# Patient Record
Sex: Female | Born: 1945 | Race: White | Hispanic: No | Smoking: Never smoker
Health system: Southern US, Community
[De-identification: ages and names within clinical notes are randomized; demographics above are authoritative.]

## PROBLEM LIST (undated history)

## (undated) DIAGNOSIS — C801 Malignant (primary) neoplasm, unspecified: Secondary | ICD-10-CM

## (undated) DIAGNOSIS — I700 Atherosclerosis of aorta: Secondary | ICD-10-CM

## (undated) DIAGNOSIS — T8859XA Other complications of anesthesia, initial encounter: Secondary | ICD-10-CM

## (undated) DIAGNOSIS — K5790 Diverticulosis of intestine, part unspecified, without perforation or abscess without bleeding: Secondary | ICD-10-CM

## (undated) DIAGNOSIS — F32A Depression, unspecified: Secondary | ICD-10-CM

## (undated) DIAGNOSIS — K8020 Calculus of gallbladder without cholecystitis without obstruction: Secondary | ICD-10-CM

## (undated) DIAGNOSIS — T4145XA Adverse effect of unspecified anesthetic, initial encounter: Secondary | ICD-10-CM

## (undated) DIAGNOSIS — I341 Nonrheumatic mitral (valve) prolapse: Secondary | ICD-10-CM

## (undated) DIAGNOSIS — M810 Age-related osteoporosis without current pathological fracture: Secondary | ICD-10-CM

## (undated) DIAGNOSIS — Z8679 Personal history of other diseases of the circulatory system: Secondary | ICD-10-CM

## (undated) DIAGNOSIS — E872 Acidosis, unspecified: Secondary | ICD-10-CM

## (undated) DIAGNOSIS — U071 COVID-19: Secondary | ICD-10-CM

## (undated) DIAGNOSIS — E7800 Pure hypercholesterolemia, unspecified: Secondary | ICD-10-CM

## (undated) DIAGNOSIS — K851 Biliary acute pancreatitis without necrosis or infection: Secondary | ICD-10-CM

## (undated) DIAGNOSIS — F329 Major depressive disorder, single episode, unspecified: Secondary | ICD-10-CM

## (undated) DIAGNOSIS — K219 Gastro-esophageal reflux disease without esophagitis: Secondary | ICD-10-CM

## (undated) DIAGNOSIS — E042 Nontoxic multinodular goiter: Secondary | ICD-10-CM

## (undated) DIAGNOSIS — K319 Disease of stomach and duodenum, unspecified: Secondary | ICD-10-CM

## (undated) HISTORY — PX: TONSILLECTOMY: SUR1361

## (undated) HISTORY — PX: ABDOMINAL HYSTERECTOMY: SHX81

## (undated) HISTORY — PX: BACK SURGERY: SHX140

## (undated) HISTORY — PX: TUBAL LIGATION: SHX77

## (undated) HISTORY — PX: OTHER SURGICAL HISTORY: SHX169

## (undated) HISTORY — PX: FOOT SURGERY: SHX648

## (undated) HISTORY — PX: COLONOSCOPY: SHX174

---

## 2004-11-21 ENCOUNTER — Ambulatory Visit: Payer: Self-pay | Admitting: Internal Medicine

## 2004-11-28 ENCOUNTER — Ambulatory Visit: Payer: Self-pay | Admitting: Internal Medicine

## 2005-05-30 ENCOUNTER — Ambulatory Visit: Payer: Self-pay | Admitting: Internal Medicine

## 2005-06-29 ENCOUNTER — Emergency Department: Payer: Self-pay | Admitting: Emergency Medicine

## 2005-09-07 ENCOUNTER — Ambulatory Visit: Payer: Self-pay | Admitting: Unknown Physician Specialty

## 2005-12-03 ENCOUNTER — Ambulatory Visit: Payer: Self-pay | Admitting: Internal Medicine

## 2007-03-19 ENCOUNTER — Ambulatory Visit: Payer: Self-pay | Admitting: Internal Medicine

## 2007-04-08 ENCOUNTER — Ambulatory Visit: Payer: Self-pay | Admitting: Gastroenterology

## 2008-04-06 ENCOUNTER — Ambulatory Visit: Payer: Self-pay | Admitting: Internal Medicine

## 2008-04-13 ENCOUNTER — Ambulatory Visit: Payer: Self-pay | Admitting: Internal Medicine

## 2009-04-07 ENCOUNTER — Ambulatory Visit: Payer: Self-pay | Admitting: Internal Medicine

## 2010-08-23 ENCOUNTER — Ambulatory Visit: Payer: Self-pay | Admitting: General Practice

## 2011-02-12 ENCOUNTER — Ambulatory Visit: Payer: Self-pay | Admitting: Internal Medicine

## 2012-02-26 ENCOUNTER — Ambulatory Visit: Payer: Self-pay

## 2012-03-12 ENCOUNTER — Ambulatory Visit: Payer: Self-pay | Admitting: Gastroenterology

## 2012-03-14 LAB — PATHOLOGY REPORT

## 2013-02-26 ENCOUNTER — Ambulatory Visit: Payer: Self-pay

## 2013-03-06 ENCOUNTER — Ambulatory Visit: Payer: Self-pay

## 2014-03-16 ENCOUNTER — Ambulatory Visit: Payer: Self-pay

## 2014-07-30 ENCOUNTER — Emergency Department
Admission: EM | Admit: 2014-07-30 | Discharge: 2014-07-30 | Disposition: A | Discharge status: Home / Self Care | Payer: PPO | Attending: Emergency Medicine | Admitting: Emergency Medicine

## 2014-07-30 ENCOUNTER — Emergency Department: Payer: PPO

## 2014-07-30 ENCOUNTER — Encounter: Payer: Self-pay | Admitting: Emergency Medicine

## 2014-07-30 DIAGNOSIS — Y9389 Activity, other specified: Secondary | ICD-10-CM | POA: Insufficient documentation

## 2014-07-30 DIAGNOSIS — S40811A Abrasion of right upper arm, initial encounter: Secondary | ICD-10-CM

## 2014-07-30 DIAGNOSIS — S8992XA Unspecified injury of left lower leg, initial encounter: Secondary | ICD-10-CM | POA: Insufficient documentation

## 2014-07-30 DIAGNOSIS — Z79899 Other long term (current) drug therapy: Secondary | ICD-10-CM | POA: Insufficient documentation

## 2014-07-30 DIAGNOSIS — W010XXA Fall on same level from slipping, tripping and stumbling without subsequent striking against object, initial encounter: Secondary | ICD-10-CM | POA: Insufficient documentation

## 2014-07-30 DIAGNOSIS — Y9289 Other specified places as the place of occurrence of the external cause: Secondary | ICD-10-CM | POA: Insufficient documentation

## 2014-07-30 DIAGNOSIS — Y998 Other external cause status: Secondary | ICD-10-CM | POA: Insufficient documentation

## 2014-07-30 DIAGNOSIS — S40021A Contusion of right upper arm, initial encounter: Secondary | ICD-10-CM

## 2014-07-30 DIAGNOSIS — S4991XA Unspecified injury of right shoulder and upper arm, initial encounter: Secondary | ICD-10-CM | POA: Diagnosis present

## 2014-07-30 HISTORY — DX: Pure hypercholesterolemia, unspecified: E78.00

## 2014-07-30 MED ORDER — OXYCODONE-ACETAMINOPHEN 5-325 MG PO TABS
ORAL_TABLET | ORAL | Status: AC
  Administered 2014-07-30: 1 via ORAL
  Filled 2014-07-30: qty 1

## 2014-07-30 MED ORDER — NEOMYCIN-POLYMYXIN-PRAMOXINE 1 % EX CREA: TOPICAL_CREAM | Freq: Two times a day (BID) | CUTANEOUS | Status: DC

## 2014-07-30 MED ORDER — TRAMADOL HCL 50 MG PO TABS: 50.0000 mg | ORAL_TABLET | Freq: Four times a day (QID) | ORAL | Status: DC | PRN

## 2014-07-30 MED ORDER — ONDANSETRON 8 MG PO TBDP
ORAL_TABLET | ORAL | Status: AC
  Administered 2014-07-30: 8 mg via ORAL
  Filled 2014-07-30: qty 1

## 2014-07-30 MED ORDER — OXYCODONE-ACETAMINOPHEN 5-325 MG PO TABS
1.0000 | ORAL_TABLET | Freq: Once | ORAL | Status: AC
  Administered 2014-07-30: 1 via ORAL

## 2014-07-30 MED ORDER — ONDANSETRON 8 MG PO TBDP
8.0000 mg | ORAL_TABLET | Freq: Once | ORAL | Status: AC
  Administered 2014-07-30: 8 mg via ORAL

## 2014-07-30 NOTE — ED Notes (Signed)
States she fell this am  Abrasion to left knee right hand..having pain to right upper arm

## 2014-07-30 NOTE — ED Provider Notes (Signed)
[facility] Emergency Department Provider Note    ____________________________________________  Time seen: 7846 hrs.  I have reviewed the triage vital signs and the nursing notes.   HISTORY  Chief Complaint Fall       HPI Sabrina Burns is a 69 y.o. female who fell her driveway after tripping over a broken patch of semen. Patient initially landed on her left knee and broke breaking the fall for right hand. She did fell forward landed on her right arm. Patient states since the onset she has had decreased range of motion and pain to the right arm. Also complaining of swelling to the right hand and pain to the left knee. Patient's rate no pain as a 9/10. Patient described a pain shot. State movement of the arm seems to increase the pain to her shoulders. Patient stated there is nausea but no vomiting at this time.     Past Medical History  Diagnosis Date  . Hypercholesteremia unkn    There are no active problems to display for this patient.   History reviewed. No pertinent past surgical history.  Current Outpatient Rx  Name  Route  Sig  Dispense  Refill  . citalopram (CELEXA) 20 MG tablet   Oral   Take 20 mg by mouth daily.         . simvastatin (ZOCOR) 20 MG tablet   Oral   Take 20 mg by mouth daily.           Allergies Review of patient's allergies indicates no known allergies.  History reviewed. No pertinent family history.  Social History History  Substance Use Topics  . Smoking status: Never Smoker   . Smokeless tobacco: Not on file  . Alcohol Use: No    Review of Systems  Constitutional: Negative for fever. Eyes: Negative for visual changes. ENT: Negative for sore throat. Cardiovascular: Negative for chest pain. Respiratory: Negative for shortness of breath. Gastrointestinal: Negative for abdominal pain, vomiting and diarrhea. Genitourinary: Negative for dysuria. Musculoskeletal: Negative for back pain. Positive  humerus pain Skin: Negative for rash. Abrasions to right hand and left knee. Neurological: Negative for headaches, focal weakness or numbness. Psychiatric:Negative Endocrine:Negative Hematological/Lymphatic:Negative Allergic/Immunilogical: Negative  10-point ROS otherwise negative.  ____________________________________________   PHYSICAL EXAM:  VITAL SIGNS: ED Triage Vitals  Enc Vitals Group     BP 07/30/14 1212 116/57 mmHg     Pulse Rate 07/30/14 1212 69     Resp 07/30/14 1212 22     Temp 07/30/14 1212 97.7 F (36.5 C)     Temp Source 07/30/14 1212 Oral     SpO2 07/30/14 1212 100 %     Weight 07/30/14 1212 135 lb (61.236 kg)     Height 07/30/14 1212 5\' 3"  (1.6 m)     Head Cir --      Peak Flow --      Pain Score 07/30/14 1208 9     Pain Loc --      Pain Edu? --      Excl. in [place]? --      Constitutional: Alert and oriented. Well appearing and in no distress. Eyes: Conjunctivae are normal. PERRL. Normal extraocular movements. ENT   Head: Normocephalic and atraumatic.   Nose: No congestion/rhinnorhea.   Mouth/Throat: Mucous membranes are moist.   Neck: No stridor. Hematological/Lymphatic/Immunilogical: No cervical lymphadenopathy. Cardiovascular: Normal rate, regular rhythm. Normal and symmetric distal pulses are present in all extremities. No murmurs, rubs, or gallops. Respiratory: Normal respiratory effort  without tachypnea nor retractions. Breath sounds are clear and equal bilaterally. No wheezes/rales/rhonchi. Gastrointestinal: Soft and nontender. No distention. No abdominal bruits. There is no CVA tenderness. Genitourinary: Not examined Musculoskeletal:tender with decreased range of motion in right arm. No joint effusions.  Tenderness to anterior left patella. Neurologic:  Normal speech and language. No gross focal neurologic deficits are appreciated. Speech is normal. No gait instability. Skin:  Skin is warm, dry and intact. No rash noted. Abrasions  noted to the right hand and left knee. Psychiatric: Mood and affect are normal. Speech and behavior are normal. Patient exhibits appropriate insight and judgment.  ____________________________________________    LABS (pertinent positives/negatives)  Negative  ____________________________________________   EKG  Negative  ____________________________________________    RADIOLOGY  X-ray was negative for fractures of the right humerus shoulder  ____________________________________________   PROCEDURES  Procedure(s) performed: None  Critical Care performed: No  ____________________________________________   INITIAL IMPRESSION / ASSESSMENT AND PLAN / ED COURSE  Pertinent labs & imaging results that were available during my care of the patient were reviewed by me and considered in my medical decision making (see chart for details).  Contusion right arm. Abrasion to right hand. Contusion abrasion to left knee.  ____________________________________________   FINAL CLINICAL IMPRESSION(S) / ED DIAGNOSES  Final diagnoses:  Arm contusion, right, initial encounter  Arm abrasion, right, initial encounter     Sable Feil, PA-C 07/30/14 1433  Ahmed Prima, MD 07/30/14 Vernelle Emerald

## 2014-07-30 NOTE — ED Notes (Signed)
Pt informed to return if any life threatening symptoms occur.  

## 2014-07-30 NOTE — Discharge Instructions (Signed)
Abrasions An abrasion is a cut or scrape of the skin. Abrasions do not go through all layers of the skin. HOME CARE  If a bandage (dressing) was put on your wound, change it as told by your doctor. If the bandage sticks, soak it off with warm.  Wash the area with water and soap 2 times a day. Rinse off the soap. Pat the area dry with a clean towel.  Put on medicated cream (ointment) as told by your doctor.  Change your bandage right away if it gets wet or dirty.  Only take medicine as told by your doctor.  See your doctor within 24-48 hours to get your wound checked.  Check your wound for redness, puffiness (swelling), or yellowish-white fluid (pus). GET HELP RIGHT AWAY IF:   You have more pain in the wound.  You have redness, swelling, or tenderness around the wound.  You have pus coming from the wound.  You have a fever or lasting symptoms for more than 2-3 days.  You have a fever and your symptoms suddenly get worse.  You have a bad smell coming from the wound or bandage. MAKE SURE YOU:   Understand these instructions.  Will watch your condition.  Will get help right away if you are not doing well or get worse. Document Released: 08/29/2007 Document Revised: 12/05/2011 Document Reviewed: 02/13/2011 [facility] Patient Information 2015 [place], [place]. This information is not intended to replace advice given to you by your health care provider. Make sure you discuss any questions you have with your health care provider.  Blunt Trauma You have been evaluated for injuries. You have been examined and your caregiver has not found injuries serious enough to require hospitalization. It is common to have multiple bruises and sore muscles following an accident. These tend to feel worse for the first 24 hours. You will feel more stiffness and soreness over the next several hours and worse when you wake up the first morning after your accident. After this point, you should begin to  improve with each passing day. The amount of improvement depends on the amount of damage done in the accident. Following your accident, if some part of your body does not work as it should, or if the pain in any area continues to increase, you should return to the Emergency Department for re-evaluation.  HOME CARE INSTRUCTIONS  Routine care for sore areas should include:  Ice to sore areas every 2 hours for 20 minutes while awake for the next 2 days.  Drink extra fluids (not alcohol).  Take a hot or warm shower or bath once or twice a day to increase blood flow to sore muscles. This will help you "limber up".  Activity as tolerated. Lifting may aggravate neck or back pain.  Only take over-the-counter or prescription medicines for pain, discomfort, or fever as directed by your caregiver. Do not use aspirin. This may increase bruising or increase bleeding if there are small areas where this is happening. SEEK IMMEDIATE MEDICAL CARE IF:  Numbness, tingling, weakness, or problem with the use of your arms or legs.  A severe headache is not relieved with medications.  There is a change in bowel or bladder control.  Increasing pain in any areas of the body.  Short of breath or dizzy.  Nauseated, vomiting, or sweating.  Increasing belly (abdominal) discomfort.  Blood in urine, stool, or vomiting blood.  Pain in either shoulder in an area where a shoulder strap would be.  Feelings  of lightheadedness or if you have a fainting episode. Sometimes it is not possible to identify all injuries immediately after the trauma. It is important that you continue to monitor your condition after the emergency department visit. If you feel you are not improving, or improving more slowly than should be expected, call your physician. If you feel your symptoms (problems) are worsening, return to the Emergency Department immediately. Document Released: 12/06/2000 Document Revised: 06/04/2011 Document  Reviewed: 10/29/2007 [facility] Patient Information 2015 [place], [place]. This information is not intended to replace advice given to you by your health care provider. Make sure you discuss any questions you have with your health care provider. With sling for 3-4 days as needed.

## 2015-02-28 ENCOUNTER — Other Ambulatory Visit: Payer: Self-pay | Admitting: Infectious Diseases

## 2015-02-28 DIAGNOSIS — Z1231 Encounter for screening mammogram for malignant neoplasm of breast: Secondary | ICD-10-CM

## 2015-03-18 ENCOUNTER — Ambulatory Visit
Admission: RE | Admit: 2015-03-18 | Discharge: 2015-03-18 | Disposition: A | Discharge status: Home / Self Care | Payer: PPO | Source: Ambulatory Visit | Attending: Infectious Diseases | Admitting: Infectious Diseases

## 2015-03-18 DIAGNOSIS — Z1231 Encounter for screening mammogram for malignant neoplasm of breast: Secondary | ICD-10-CM

## 2015-03-29 DIAGNOSIS — M25511 Pain in right shoulder: Secondary | ICD-10-CM | POA: Diagnosis not present

## 2015-03-29 DIAGNOSIS — G8929 Other chronic pain: Secondary | ICD-10-CM | POA: Diagnosis not present

## 2015-03-31 DIAGNOSIS — G8929 Other chronic pain: Secondary | ICD-10-CM | POA: Diagnosis not present

## 2015-03-31 DIAGNOSIS — M25511 Pain in right shoulder: Secondary | ICD-10-CM | POA: Diagnosis not present

## 2015-04-08 DIAGNOSIS — M25511 Pain in right shoulder: Secondary | ICD-10-CM | POA: Diagnosis not present

## 2015-04-08 DIAGNOSIS — G8929 Other chronic pain: Secondary | ICD-10-CM | POA: Diagnosis not present

## 2015-04-11 DIAGNOSIS — G8929 Other chronic pain: Secondary | ICD-10-CM | POA: Diagnosis not present

## 2015-04-11 DIAGNOSIS — M25511 Pain in right shoulder: Secondary | ICD-10-CM | POA: Diagnosis not present

## 2015-04-14 DIAGNOSIS — M25511 Pain in right shoulder: Secondary | ICD-10-CM | POA: Diagnosis not present

## 2015-04-14 DIAGNOSIS — G8929 Other chronic pain: Secondary | ICD-10-CM | POA: Diagnosis not present

## 2015-07-25 DIAGNOSIS — M75121 Complete rotator cuff tear or rupture of right shoulder, not specified as traumatic: Secondary | ICD-10-CM | POA: Diagnosis not present

## 2015-07-25 DIAGNOSIS — M7581 Other shoulder lesions, right shoulder: Secondary | ICD-10-CM | POA: Diagnosis not present

## 2015-07-26 ENCOUNTER — Other Ambulatory Visit: Payer: Self-pay | Admitting: Surgery

## 2015-07-26 DIAGNOSIS — M75121 Complete rotator cuff tear or rupture of right shoulder, not specified as traumatic: Secondary | ICD-10-CM

## 2015-07-26 DIAGNOSIS — M7581 Other shoulder lesions, right shoulder: Secondary | ICD-10-CM

## 2015-08-12 ENCOUNTER — Ambulatory Visit
Admission: RE | Admit: 2015-08-12 | Discharge: 2015-08-12 | Disposition: A | Discharge status: Home / Self Care | Payer: PPO | Source: Ambulatory Visit | Attending: Surgery | Admitting: Surgery

## 2015-08-12 DIAGNOSIS — M75121 Complete rotator cuff tear or rupture of right shoulder, not specified as traumatic: Secondary | ICD-10-CM | POA: Diagnosis not present

## 2015-08-12 DIAGNOSIS — M7581 Other shoulder lesions, right shoulder: Secondary | ICD-10-CM | POA: Diagnosis not present

## 2015-08-12 DIAGNOSIS — M75112 Incomplete rotator cuff tear or rupture of left shoulder, not specified as traumatic: Secondary | ICD-10-CM | POA: Diagnosis not present

## 2015-08-15 DIAGNOSIS — M7581 Other shoulder lesions, right shoulder: Secondary | ICD-10-CM | POA: Diagnosis not present

## 2015-08-15 DIAGNOSIS — S4991XA Unspecified injury of right shoulder and upper arm, initial encounter: Secondary | ICD-10-CM | POA: Diagnosis not present

## 2015-08-15 DIAGNOSIS — M75121 Complete rotator cuff tear or rupture of right shoulder, not specified as traumatic: Secondary | ICD-10-CM | POA: Diagnosis not present

## 2015-08-29 ENCOUNTER — Encounter
Admission: RE | Admit: 2015-08-29 | Discharge: 2015-08-29 | Disposition: A | Discharge status: Home / Self Care | Payer: PPO | Source: Ambulatory Visit | Attending: Surgery | Admitting: Surgery

## 2015-08-29 ENCOUNTER — Other Ambulatory Visit: Payer: PPO

## 2015-08-29 DIAGNOSIS — Z01812 Encounter for preprocedural laboratory examination: Secondary | ICD-10-CM | POA: Diagnosis not present

## 2015-08-29 DIAGNOSIS — I340 Nonrheumatic mitral (valve) insufficiency: Secondary | ICD-10-CM | POA: Diagnosis not present

## 2015-08-29 HISTORY — DX: Major depressive disorder, single episode, unspecified: F32.9

## 2015-08-29 HISTORY — DX: Nonrheumatic mitral (valve) prolapse: I34.1

## 2015-08-29 HISTORY — DX: Personal history of other diseases of the circulatory system: Z86.79

## 2015-08-29 HISTORY — DX: Age-related osteoporosis without current pathological fracture: M81.0

## 2015-08-29 HISTORY — DX: Depression, unspecified: F32.A

## 2015-08-29 LAB — CBC
HCT: 39.8 % (ref 35.0–47.0)
Hemoglobin: 13.3 g/dL (ref 12.0–16.0)
MCH: 30.5 pg (ref 26.0–34.0)
MCHC: 33.5 g/dL (ref 32.0–36.0)
MCV: 90.8 fL (ref 80.0–100.0)
PLATELETS: 173 10*3/uL (ref 150–440)
RBC: 4.38 MIL/uL (ref 3.80–5.20)
RDW: 12.6 % (ref 11.5–14.5)
WBC: 3.6 10*3/uL (ref 3.6–11.0)

## 2015-08-29 NOTE — Patient Instructions (Signed)
Your procedure is scheduled on: Thursday 09/15/15 Report to Day Surgery. 2ND FLOOR MEDICAL MALL ENTRANCE To find out your arrival time please call [phone] between 1PM - 3PM on Wednesday 09/14/15.  Remember: Instructions that are not followed completely may result in serious medical risk, up to and including death, or upon the discretion of your surgeon and anesthesiologist your surgery may need to be rescheduled.    __X__ 1. Do not eat food or drink liquids after midnight. No gum chewing or hard candies.     __X__ 2. No Alcohol for 24 hours before or after surgery.   ____ 3. Bring all medications with you on the day of surgery if instructed.    __X__ 4. Notify your doctor if there is any change in your medical condition     (cold, fever, infections).     Do not wear jewelry, make-up, hairpins, clips or nail polish.  Do not wear lotions, powders, or perfumes.   Do not shave 48 hours prior to surgery. Men may shave face and neck.  Do not bring valuables to the hospital.    [facility] is not responsible for any belongings or valuables.               Contacts, dentures or bridgework may not be worn into surgery.  Leave your suitcase in the car. After surgery it may be brought to your room.  For patients admitted to the hospital, discharge time is determined by your                treatment team.   Patients discharged the day of surgery will not be allowed to drive home.   Please read over the following fact sheets that you were given:   Surgical Site Infection Prevention   __X__ Take these medicines the morning of surgery with A SIP OF WATER:    1. CITALOPRAM  2.   3.   4.  5.  6.  ____ Fleet Enema (as directed)   __X__ Use CHG Soap as directed  ____ Use inhalers on the day of surgery  ____ Stop metformin 2 days prior to surgery    ____ Take 1/2 of usual insulin dose the night before surgery and none on the morning of surgery.   ____ Stop Coumadin/Plavix/aspirin on    ____ Stop Anti-inflammatories on    ____ Stop supplements until after surgery.    ____ Bring C-Pap to the hospital.

## 2015-09-14 MED ORDER — BACITRACIN ZINC 500 UNIT/GM EX OINT
TOPICAL_OINTMENT | CUTANEOUS | Status: AC
  Filled 2015-09-14: qty 3.6

## 2015-09-15 ENCOUNTER — Ambulatory Visit: Payer: PPO | Admitting: Anesthesiology

## 2015-09-15 ENCOUNTER — Ambulatory Visit
Admission: RE | Admit: 2015-09-15 | Discharge: 2015-09-15 | Disposition: A | Discharge status: Home / Self Care | Payer: PPO | Source: Ambulatory Visit | Attending: Surgery | Admitting: Surgery

## 2015-09-15 ENCOUNTER — Encounter: Payer: Self-pay | Admitting: *Deleted

## 2015-09-15 ENCOUNTER — Encounter
Admission: RE | Disposition: A | Discharge status: Home / Self Care | Payer: Self-pay | Source: Ambulatory Visit | Attending: Surgery

## 2015-09-15 DIAGNOSIS — E785 Hyperlipidemia, unspecified: Secondary | ICD-10-CM | POA: Diagnosis not present

## 2015-09-15 DIAGNOSIS — G8918 Other acute postprocedural pain: Secondary | ICD-10-CM | POA: Diagnosis not present

## 2015-09-15 DIAGNOSIS — M810 Age-related osteoporosis without current pathological fracture: Secondary | ICD-10-CM | POA: Diagnosis not present

## 2015-09-15 DIAGNOSIS — Z5333 Arthroscopic surgical procedure converted to open procedure: Secondary | ICD-10-CM | POA: Diagnosis not present

## 2015-09-15 DIAGNOSIS — Z90710 Acquired absence of both cervix and uterus: Secondary | ICD-10-CM | POA: Diagnosis not present

## 2015-09-15 DIAGNOSIS — F329 Major depressive disorder, single episode, unspecified: Secondary | ICD-10-CM | POA: Insufficient documentation

## 2015-09-15 DIAGNOSIS — M7521 Bicipital tendinitis, right shoulder: Secondary | ICD-10-CM | POA: Insufficient documentation

## 2015-09-15 DIAGNOSIS — M25511 Pain in right shoulder: Secondary | ICD-10-CM | POA: Diagnosis not present

## 2015-09-15 DIAGNOSIS — M75121 Complete rotator cuff tear or rupture of right shoulder, not specified as traumatic: Secondary | ICD-10-CM | POA: Diagnosis not present

## 2015-09-15 DIAGNOSIS — M25811 Other specified joint disorders, right shoulder: Secondary | ICD-10-CM | POA: Insufficient documentation

## 2015-09-15 DIAGNOSIS — Z79899 Other long term (current) drug therapy: Secondary | ICD-10-CM | POA: Diagnosis not present

## 2015-09-15 DIAGNOSIS — M7591 Shoulder lesion, unspecified, right shoulder: Secondary | ICD-10-CM | POA: Diagnosis not present

## 2015-09-15 DIAGNOSIS — I341 Nonrheumatic mitral (valve) prolapse: Secondary | ICD-10-CM | POA: Diagnosis not present

## 2015-09-15 DIAGNOSIS — M94211 Chondromalacia, right shoulder: Secondary | ICD-10-CM | POA: Diagnosis not present

## 2015-09-15 DIAGNOSIS — M7541 Impingement syndrome of right shoulder: Secondary | ICD-10-CM | POA: Diagnosis not present

## 2015-09-15 DIAGNOSIS — S46111A Strain of muscle, fascia and tendon of long head of biceps, right arm, initial encounter: Secondary | ICD-10-CM | POA: Diagnosis not present

## 2015-09-15 DIAGNOSIS — M7581 Other shoulder lesions, right shoulder: Secondary | ICD-10-CM | POA: Diagnosis not present

## 2015-09-15 HISTORY — PX: SHOULDER ARTHROSCOPY WITH OPEN ROTATOR CUFF REPAIR: SHX6092

## 2015-09-15 SURGERY — ARTHROSCOPY, SHOULDER WITH REPAIR, ROTATOR CUFF, OPEN
Anesthesia: General | Site: Shoulder | Laterality: Right | Wound class: Clean

## 2015-09-15 MED ORDER — PHENYLEPHRINE HCL 10 MG/ML IJ SOLN
INTRAMUSCULAR | Status: DC | PRN
  Administered 2015-09-15: 100 ug via INTRAVENOUS
  Administered 2015-09-15: 50 ug via INTRAVENOUS
  Administered 2015-09-15: 100 ug via INTRAVENOUS

## 2015-09-15 MED ORDER — BUPIVACAINE-EPINEPHRINE 0.5% -1:200000 IJ SOLN
INTRAMUSCULAR | Status: DC | PRN
  Administered 2015-09-15: 21 mL

## 2015-09-15 MED ORDER — CEFAZOLIN IN D5W 1 GM/50ML IV SOLN
1.0000 g | Freq: Once | INTRAVENOUS | Status: AC
  Administered 2015-09-15: 1 g via INTRAVENOUS

## 2015-09-15 MED ORDER — ONDANSETRON HCL 4 MG/2ML IJ SOLN: 4.0000 mg | Freq: Once | INTRAMUSCULAR | Status: DC | PRN

## 2015-09-15 MED ORDER — CEFAZOLIN SODIUM 1 G IJ SOLR
INTRAMUSCULAR | Status: DC | PRN
  Administered 2015-09-15: 1 g via INTRAMUSCULAR

## 2015-09-15 MED ORDER — ROCURONIUM BROMIDE 100 MG/10ML IV SOLN
INTRAVENOUS | Status: DC | PRN
  Administered 2015-09-15: 30 mg via INTRAVENOUS

## 2015-09-15 MED ORDER — NEOSTIGMINE METHYLSULFATE 10 MG/10ML IV SOLN
INTRAVENOUS | Status: DC | PRN
  Administered 2015-09-15: 3 mg via INTRAVENOUS

## 2015-09-15 MED ORDER — FENTANYL CITRATE (PF) 100 MCG/2ML IJ SOLN
50.0000 ug | Freq: Once | INTRAMUSCULAR | Status: AC
  Administered 2015-09-15: 50 ug via INTRAVENOUS

## 2015-09-15 MED ORDER — LIDOCAINE HCL (CARDIAC) 20 MG/ML IV SOLN
INTRAVENOUS | Status: DC | PRN
  Administered 2015-09-15: 40 mg via INTRAVENOUS

## 2015-09-15 MED ORDER — FENTANYL CITRATE (PF) 100 MCG/2ML IJ SOLN
INTRAMUSCULAR | Status: DC | PRN
  Administered 2015-09-15: 50 ug via INTRAVENOUS

## 2015-09-15 MED ORDER — FENTANYL CITRATE (PF) 100 MCG/2ML IJ SOLN
INTRAMUSCULAR | Status: AC
  Filled 2015-09-15: qty 2

## 2015-09-15 MED ORDER — EPINEPHRINE HCL 1 MG/ML IJ SOLN
INTRAMUSCULAR | Status: DC | PRN
  Administered 2015-09-15: 2 mg

## 2015-09-15 MED ORDER — FENTANYL CITRATE (PF) 100 MCG/2ML IJ SOLN: 25.0000 ug | INTRAMUSCULAR | Status: DC | PRN

## 2015-09-15 MED ORDER — PROPOFOL 10 MG/ML IV BOLUS
INTRAVENOUS | Status: DC | PRN
  Administered 2015-09-15: 90 mg via INTRAVENOUS

## 2015-09-15 MED ORDER — MIDAZOLAM HCL 5 MG/ML IJ SOLN: 1.0000 mg | Freq: Once | INTRAMUSCULAR | Status: DC

## 2015-09-15 MED ORDER — OXYCODONE HCL 5 MG PO TABS: 5.0000 mg | ORAL_TABLET | ORAL | Status: DC | PRN

## 2015-09-15 MED ORDER — LACTATED RINGERS IV SOLN
INTRAVENOUS | Status: DC
  Administered 2015-09-15: 10:00:00 via INTRAVENOUS

## 2015-09-15 MED ORDER — DEXAMETHASONE SODIUM PHOSPHATE 10 MG/ML IJ SOLN
INTRAMUSCULAR | Status: DC | PRN
  Administered 2015-09-15: 4 mg via INTRAVENOUS

## 2015-09-15 MED ORDER — FAMOTIDINE 20 MG PO TABS
20.0000 mg | ORAL_TABLET | Freq: Once | ORAL | Status: AC
  Administered 2015-09-15: 20 mg via ORAL

## 2015-09-15 MED ORDER — SODIUM CHLORIDE 0.9 % IV SOLN
10000.0000 ug | INTRAVENOUS | Status: DC | PRN
  Administered 2015-09-15: 10 ug/min via INTRAVENOUS

## 2015-09-15 MED ORDER — MIDAZOLAM HCL 5 MG/5ML IJ SOLN
INTRAMUSCULAR | Status: AC
  Administered 2015-09-15: 1 mg
  Filled 2015-09-15: qty 5

## 2015-09-15 MED ORDER — GLYCOPYRROLATE 0.2 MG/ML IJ SOLN
INTRAMUSCULAR | Status: DC | PRN
  Administered 2015-09-15: 0.4 mg via INTRAVENOUS

## 2015-09-15 MED ORDER — ONDANSETRON HCL 4 MG/2ML IJ SOLN
INTRAMUSCULAR | Status: DC | PRN
  Administered 2015-09-15: 4 mg via INTRAVENOUS

## 2015-09-15 SURGICAL SUPPLY — 45 items
ANCHOR JUGGERKNOT WTAP NDL 2.9 (Anchor) ×4 IMPLANT
ANCHOR SUT QUATTRO KNTLS 4.5 (Anchor) ×2 IMPLANT
BIT DRILL JUGRKNT W/NDL BIT2.9 (DRILL) ×1 IMPLANT
BLADE FULL RADIUS 3.5 (BLADE) ×2 IMPLANT
BLADE SHAVER 4.5X7 STR FR (MISCELLANEOUS) IMPLANT
BUR ACROMIONIZER 4.0 (BURR) ×2 IMPLANT
BUR BR 5.5 WIDE MOUTH (BURR) IMPLANT
CANNULA SHAVER 8MMX76MM (CANNULA) IMPLANT
CHLORAPREP W/TINT 26ML (MISCELLANEOUS) ×4 IMPLANT
COVER MAYO STAND STRL (DRAPES) ×2 IMPLANT
DRAPE IMP U-DRAPE 54X76 (DRAPES) ×4 IMPLANT
DRILL JUGGERKNOT W/NDL BIT 2.9 (DRILL) ×2
DRSG OPSITE POSTOP 4X8 (GAUZE/BANDAGES/DRESSINGS) ×2 IMPLANT
ELECT REM PT RETURN 9FT ADLT (ELECTROSURGICAL) ×2
ELECTRODE REM PT RTRN 9FT ADLT (ELECTROSURGICAL) ×1 IMPLANT
GAUZE PETRO XEROFOAM 1X8 (MISCELLANEOUS) ×2 IMPLANT
GAUZE SPONGE 4X4 12PLY STRL (GAUZE/BANDAGES/DRESSINGS) ×2 IMPLANT
GLOVE BIO SURGEON STRL SZ7.5 (GLOVE) ×4 IMPLANT
GLOVE BIO SURGEON STRL SZ8 (GLOVE) ×4 IMPLANT
GLOVE BIOGEL PI IND STRL 8 (GLOVE) ×1 IMPLANT
GLOVE BIOGEL PI INDICATOR 8 (GLOVE) ×1
GLOVE INDICATOR 8.0 STRL GRN (GLOVE) ×2 IMPLANT
GOWN STRL REUS W/ TWL LRG LVL3 (GOWN DISPOSABLE) ×2 IMPLANT
GOWN STRL REUS W/ TWL XL LVL3 (GOWN DISPOSABLE) ×1 IMPLANT
GOWN STRL REUS W/TWL LRG LVL3 (GOWN DISPOSABLE) ×2
GOWN STRL REUS W/TWL XL LVL3 (GOWN DISPOSABLE) ×1
GRASPER SUT 15 45D LOW PRO (SUTURE) ×4 IMPLANT
IV LACTATED RINGER IRRG 3000ML (IV SOLUTION) ×2
IV LR IRRIG 3000ML ARTHROMATIC (IV SOLUTION) ×2 IMPLANT
MANIFOLD NEPTUNE II (INSTRUMENTS) ×2 IMPLANT
MASK FACE SPIDER DISP (MASK) ×2 IMPLANT
MAT BLUE FLOOR 46X72 FLO (MISCELLANEOUS) ×2 IMPLANT
NEEDLE REVERSE CUT 1/2 CRC (NEEDLE) ×2 IMPLANT
PACK ARTHROSCOPY SHOULDER (MISCELLANEOUS) ×2 IMPLANT
SLING ARM LRG DEEP (SOFTGOODS) ×2 IMPLANT
SLING ULTRA II LG (MISCELLANEOUS) ×2 IMPLANT
STAPLER SKIN PROX 35W (STAPLE) ×2 IMPLANT
STRAP SAFETY BODY (MISCELLANEOUS) ×2 IMPLANT
SUT ETHIBOND 0 MO6 C/R (SUTURE) ×2 IMPLANT
SUT VIC AB 2-0 CT1 27 (SUTURE) ×2
SUT VIC AB 2-0 CT1 TAPERPNT 27 (SUTURE) ×2 IMPLANT
TAPE MICROFOAM 4IN (TAPE) ×2 IMPLANT
TUBING ARTHRO INFLOW-ONLY STRL (TUBING) ×2 IMPLANT
TUBING CONNECTING 10 (TUBING) ×2 IMPLANT
WAND HAND CNTRL MULTIVAC 90 (MISCELLANEOUS) ×2 IMPLANT

## 2015-09-15 NOTE — Progress Notes (Signed)
Pt transferred to PACU for a block per Dr Marcello Moores via stretcher,  Accompanied by RN report given to [place]

## 2015-09-15 NOTE — Transfer of Care (Signed)
Immediate Anesthesia Transfer of Care Note  Patient: Marylen Ponto  Procedure(s) Performed: Procedure(s): SHOULDER ARTHROSCOPY WITH OPEN ROTATOR CUFF REPAIR (Right)  Patient Location: PACU  Anesthesia Type:General  Level of Consciousness: sedated and responds to stimulation  Airway & Oxygen Therapy: Patient Spontanous Breathing and Patient connected to face mask oxygen  Post-op Assessment: Report given to RN and Post -op Vital signs reviewed and stable  Post vital signs: Reviewed and stable  Last Vitals:  Filed Vitals:   09/15/15 0930 09/15/15 1000  BP: 130/55 128/65  Pulse: 71   Temp: 36.4 C   Resp: 20     Last Pain:  Filed Vitals:   09/15/15 1010  PainSc: 3       Patients Stated Pain Goal: 2 ([id number])  Complications: No apparent anesthesia complications

## 2015-09-15 NOTE — Anesthesia Procedure Notes (Addendum)
Procedure Name: Intubation Performed by: Lance Muss Pre-anesthesia Checklist: Emergency Drugs available, Patient identified, Suction available, Patient being monitored and Timeout performed Patient Re-evaluated:Patient Re-evaluated prior to inductionOxygen Delivery Method: Circle system utilized Preoxygenation: Pre-oxygenation with 100% oxygen Intubation Type: IV induction Ventilation: Mask ventilation without difficulty Laryngoscope Size: Mac and 3 Grade View: Grade I Tube type: Oral Tube size: 7.0 mm Number of attempts: 1 Airway Equipment and Method: Stylet and LTA kit utilized Placement Confirmation: ETT inserted through vocal cords under direct vision,  positive ETCO2 and breath sounds checked- equal and bilateral Secured at: 21 cm Tube secured with: Tape Dental Injury: Teeth and Oropharynx as per pre-operative assessment    Anesthesia Regional Block:  Interscalene brachial plexus block  Pre-Anesthetic Checklist: ,, timeout performed, Correct Patient, Correct Site, Correct Laterality, Correct Procedure, Correct Position, site marked, Risks and benefits discussed,  Surgical consent,  Pre-op evaluation,  At surgeon's request and post-op pain management   Prep: Betadine       Needles:  Injection technique: Single-shot  Needle Type: Echogenic Stimulator Needle     Needle Length: 5cm 5 cm Needle Gauge: 21 and 21 G    Additional Needles:  Procedures: ultrasound guided (picture in chart) and nerve stimulator Interscalene brachial plexus block  Nerve Stimulator or Paresthesia:  Response: biceps flexion, 0.8 mA,   Additional Responses:   Narrative:  Injection made incrementally with aspirations every 5 mL.  Performed by: Personally  Anesthesiologist: Gunnar Bulla  Additional Notes: Functioning IV was confirmed and monitors were applied.  A 60m 22ga Arrow echogenic stimulator needle was used. Sterile prep and drape,hand hygiene and sterile gloves were used.   Negative aspiration and negative test dose prior to incremental administration of local anesthetic. The patient tolerated the procedure well.  Ultrasound guidance: relevent anatomy identified, needle position confirmed, local anesthetic spread visualized around nerve(s), vascular puncture avoided.  Image printed for medical record. Marcaine .25% 20 ml and 0.1 mg epi.

## 2015-09-15 NOTE — H&P (Signed)
Paper H&P to be scanned into permanent record. H&P reviewed. No changes. 

## 2015-09-15 NOTE — Discharge Instructions (Addendum)
Keep dressing dry and intact.  May shower after dressing changed on post-op day #4 (Monday).  Cover staples with Band-Aids after drying off. Apply ice frequently to shoulder. Keep shoulder immobilizer on at all times except may remove for bathing purposes. Follow-up in 10-14 days or as scheduled.   General Anesthesia, Adult General anesthesia is a sleep-like state of non-feeling produced by medicines (anesthetics). General anesthesia prevents you from being alert and feeling pain during a medical procedure. Your caregiver may recommend general anesthesia if your procedure:  Is long.  Is painful or uncomfortable.  Would be frightening to see or hear.  Requires you to be still.  Affects your breathing.  Causes significant blood loss. LET YOUR CAREGIVER KNOW ABOUT:  Allergies to food or medicine.  Medicines taken, including vitamins, herbs, eyedrops, over-the-counter medicines, and creams.  Use of steroids (by mouth or creams).  Previous problems with anesthetics or numbing medicines, including problems experienced by relatives.  History of bleeding problems or blood clots.  Previous surgeries and types of anesthetics received.  Possibility of pregnancy, if this applies.  Use of cigarettes, alcohol, or illegal drugs.  Any health condition(s), especially diabetes, sleep apnea, and high blood pressure. RISKS AND COMPLICATIONS General anesthesia rarely causes complications. However, if complications do occur, they can be life threatening. Complications include:  A lung infection.  A stroke.  A heart attack.  Waking up during the procedure. When this occurs, the patient may be unable to move and communicate that he or she is awake. The patient may feel severe pain. Older adults and adults with serious medical problems are more likely to have complications than adults who are young and healthy. Some complications can be prevented by answering all of your caregiver's  questions thoroughly and by following all pre-procedure instructions. It is important to tell your caregiver if any of the pre-procedure instructions, especially those related to diet, were not followed. Any food or liquid in the stomach can cause problems when you are under general anesthesia. BEFORE THE PROCEDURE  Ask your caregiver if you will have to spend the night at the hospital. If you will not have to spend the night, arrange to have an adult drive you and stay with you for 24 hours.  Follow your caregiver's instructions if you are taking dietary supplements or medicines. Your caregiver may tell you to stop taking them or to reduce your dosage.  Do not smoke for as long as possible before your procedure. If possible, stop smoking 3-6 weeks before the procedure.  Do not take new dietary supplements or medicines within 1 week of your procedure unless your caregiver approves them.  Do not eat within 8 hours of your procedure or as directed by your caregiver. Drink only clear liquids, such as water, black coffee (without milk or cream), and fruit juices (without pulp).  Do not drink within 3 hours of your procedure or as directed by your caregiver.  You may brush your teeth on the morning of the procedure, but make sure to spit out the toothpaste and water when finished. PROCEDURE  You will receive anesthetics through a mask, through an intravenous (IV) access tube, or through both. A doctor who specializes in anesthesia (anesthesiologist) or a nurse who specializes in anesthesia (nurse anesthetist) or both will stay with you throughout the procedure to make sure you remain unconscious. He or she will also watch your blood pressure, pulse, and oxygen levels to make sure that the anesthetics do not cause any  problems. Once you are asleep, a breathing tube or mask may be used to help you breathe. AFTER THE PROCEDURE You will wake up after the procedure is complete. You may be in the room where  the procedure was performed or in a recovery area. You may have a sore throat if a breathing tube was used. You may also feel:  Dizzy.  Weak.  Drowsy.  Confused.  Nauseous.  Cold. These are all normal responses and can be expected to last for up to 24 hours after the procedure is complete. A caregiver will tell you when you are ready to go home. This will usually be when you are fully awake and in stable condition.   This information is not intended to replace advice given to you by your health care provider. Make sure you discuss any questions you have with your health care provider.   Document Released: 06/19/2007 Document Revised: 04/02/2014 Document Reviewed: 07/11/2011 Elsevier Interactive Patient Education [employer].

## 2015-09-15 NOTE — Op Note (Signed)
09/15/2015  12:29 PM  Patient:   Sabrina Burns  Pre-Op Diagnosis:   Impingement/tendinopathy with rotator cuff tear and biceps tendinopathy, right shoulder.  Postoperative diagnosis: Impingement/tendinopathy with rotator cuff tear and biceps tendinopathy, right shoulder.  Procedure: Limited arthroscopic debridement, arthroscopic subacromial decompression, mini-open rotator cuff repair, and mini-open biceps tenodesis, right shoulder.  Anesthesia: General endotracheal with interscalene block placed preoperatively by the anesthesiologist.  Surgeon:   Pascal Lux, MD  Assistant:   Alferd Apa, PA-S  Findings: As above. There were focal grade 3 chondromalacial changes involving the posterior central portion of the glenoid. The remaining portions of the articular surface were in satisfactory condition. The labrum demonstrated some degenerative changes but was without any detachment from the glenoid. The remainder of the rotator cuff was in satisfactory condition.  Complications: None  Fluids:   600 cc  Estimated blood loss: <5 cc  Tourniquet time: None  Drains: None  Closure: Staples   Brief clinical note: The patient is a 70 year old female with a history of right shoulder pain. The patient's symptoms have progressed despite medications, activity modification, etc. The patient's history and examination are consistent with impingement/tendinopathy with a rotator cuff tear. These findings were confirmed by MRI scan. The patient presents at this time for definitive management of these shoulder symptoms.  Procedure: The patient underwent placement of an interscalene block by the anesthesiologist in the preoperative holding area before she was brought into the operating room and lain in the supine position. The patient then underwent general endotracheal intubation and anesthesia before being repositioned in the beach chair position using the beach chair  positioner. The right shoulder and upper extremity were prepped with ChloraPrep solution before being draped sterilely. Preoperative antibiotics were administered. A timeout was performed to confirm the proper surgical site before the expected portal sites and incision site were injected with 0.5% Sensorcaine with epinephrine. A posterior portal was created and the glenohumeral joint thoroughly inspected with the findings as described above. An anterior portal was created using an outside-in technique. The labrum and rotator cuff were further probed, again confirming the above-noted findings. The focal area of chondromalacia was debrided using the full-radius resector, as were some areas of minor synovitis anteriorly and posterior superiorly. The biceps tendon was released from its labral attachment using the ArthroCare wand before the stump was contoured. The ArthroCare wand also was used to obtain hemostasis as well as to "anneal" the labrum superiorly and anteriorly. The instruments were removed from the joint after suctioning the excess fluid.  The camera was repositioned through the posterior portal into the subacromial space. A separate lateral portal was created using an outside-in technique. The 3.5 mm full-radius resector was introduced and used to perform a subtotal bursectomy. The ArthroCare wand was then inserted and used to remove the periosteal tissue off the undersurface of the anterior third of the acromion as well as to recess the coracoacromial ligament from its attachment along the anterior and lateral margins of the acromion. The 4.0 mm acromionizing bur was introduced and used to complete the decompression by removing the undersurface of the anterior third of the acromion. The full radius resector was reintroduced to remove any residual bony debris before the ArthroCare wand was reintroduced to obtain hemostasis. The instruments were then removed from the subacromial space after suctioning the  excess fluid.  An approximately 4-5 cm incision was made over the anterolateral aspect of the shoulder beginning at the anterolateral corner of the acromion and extending distally  in line with the bicipital groove. This incision was carried down through the subcutaneous tissues to expose the deltoid fascia. The raphae between the anterior and middle thirds was identified and this plane developed to provide access into the subacromial space. Additional bursal tissues were debrided sharply using Metzenbaum scissors. The rotator cuff tear was readily identified. The margins were debrided sharply with a #15 blade and the exposed greater tuberosity roughened with a rongeur. The tear was repaired using a single Biomet 2.9 mm JuggerKnot anchor. These sutures were then brought back laterally and secured using a [place] QuattroLink to create a two-layer closure. An apparent watertight closure was obtained.  The bicipital groove was identified by palpation and opened for 1-1.5 cm. The biceps tendon stump was retrieved through this defect. The floor of the bicipital groove was roughened with a curet before another Biomet 2.9 mm JuggerKnot anchor was inserted. Both sets of sutures were passed through the biceps tendon and tied securely to effect the tenodesis. The bicipital sheath was reapproximated using two #0 Ethibond interrupted sutures, incorporating the biceps tendon to further reinforce the tenodesis.  The wound was copiously irrigated with sterile saline solution before the deltoid raphae was reapproximated using 2-0 Vicryl interrupted sutures. The subcutaneous tissues were closed in two layers using 2-0 Vicryl interrupted sutures before the skin was closed using staples. The portal sites also were closed using staples. A sterile bulky dressing was applied to the shoulder before the arm was placed into a shoulder immobilizer. The patient was then awakened, extubated, and returned to the recovery room in  satisfactory condition after tolerating the procedure well.

## 2015-09-15 NOTE — Anesthesia Preprocedure Evaluation (Signed)
Anesthesia Evaluation  Patient identified by MRN, date of birth, ID band Patient awake    Reviewed: Allergy & Precautions, NPO status , Patient's Chart, lab work & pertinent test results, reviewed documented beta blocker date and time   Airway Mallampati: II  TM Distance: >3 FB     Dental  (+) Chipped   Pulmonary           Cardiovascular      Neuro/Psych PSYCHIATRIC DISORDERS Depression    GI/Hepatic   Endo/Other    Renal/GU      Musculoskeletal   Abdominal   Peds  Hematology   Anesthesia Other Findings   Reproductive/Obstetrics                             Anesthesia Physical Anesthesia Plan  ASA: II  Anesthesia Plan: General   Post-op Pain Management:    Induction: Intravenous  Airway Management Planned: Oral ETT  Additional Equipment:   Intra-op Plan:   Post-operative Plan:   Informed Consent: I have reviewed the patients History and Physical, chart, labs and discussed the procedure including the risks, benefits and alternatives for the proposed anesthesia with the patient or authorized representative who has indicated his/her understanding and acceptance.     Plan Discussed with: CRNA  Anesthesia Plan Comments:         Anesthesia Quick Evaluation

## 2015-09-16 NOTE — Anesthesia Postprocedure Evaluation (Signed)
Anesthesia Post Note  Patient: Sabrina Burns  Procedure(s) Performed: Procedure(s) (LRB): SHOULDER ARTHROSCOPY WITH OPEN ROTATOR CUFF REPAIR (Right)  Patient location during evaluation: PACU Anesthesia Type: General Level of consciousness: awake and alert Pain management: pain level controlled Vital Signs Assessment: post-procedure vital signs reviewed and stable Respiratory status: spontaneous breathing, nonlabored ventilation, respiratory function stable and patient connected to nasal cannula oxygen Cardiovascular status: blood pressure returned to baseline and stable Postop Assessment: no signs of nausea or vomiting Anesthetic complications: no    Last Vitals:  Filed Vitals:   09/15/15 1324 09/15/15 1357  BP: 100/59 122/64  Pulse: 66 73  Temp: 36.1 C   Resp: 16 16    Last Pain:  Filed Vitals:   09/16/15 0812  PainSc: 0-No pain                 Maile Linford S

## 2015-09-21 DIAGNOSIS — M25511 Pain in right shoulder: Secondary | ICD-10-CM | POA: Diagnosis not present

## 2015-09-21 DIAGNOSIS — G8929 Other chronic pain: Secondary | ICD-10-CM | POA: Diagnosis not present

## 2015-09-26 DIAGNOSIS — G8929 Other chronic pain: Secondary | ICD-10-CM | POA: Diagnosis not present

## 2015-09-26 DIAGNOSIS — M25511 Pain in right shoulder: Secondary | ICD-10-CM | POA: Diagnosis not present

## 2015-09-29 DIAGNOSIS — G8929 Other chronic pain: Secondary | ICD-10-CM | POA: Diagnosis not present

## 2015-09-29 DIAGNOSIS — M25511 Pain in right shoulder: Secondary | ICD-10-CM | POA: Diagnosis not present

## 2015-10-03 DIAGNOSIS — M25511 Pain in right shoulder: Secondary | ICD-10-CM | POA: Diagnosis not present

## 2015-10-03 DIAGNOSIS — G8929 Other chronic pain: Secondary | ICD-10-CM | POA: Diagnosis not present

## 2015-10-06 DIAGNOSIS — G8929 Other chronic pain: Secondary | ICD-10-CM | POA: Diagnosis not present

## 2015-10-06 DIAGNOSIS — M25511 Pain in right shoulder: Secondary | ICD-10-CM | POA: Diagnosis not present

## 2015-10-10 DIAGNOSIS — M25511 Pain in right shoulder: Secondary | ICD-10-CM | POA: Diagnosis not present

## 2015-10-10 DIAGNOSIS — G8929 Other chronic pain: Secondary | ICD-10-CM | POA: Diagnosis not present

## 2015-10-14 DIAGNOSIS — G8929 Other chronic pain: Secondary | ICD-10-CM | POA: Diagnosis not present

## 2015-10-14 DIAGNOSIS — M25511 Pain in right shoulder: Secondary | ICD-10-CM | POA: Diagnosis not present

## 2015-10-17 DIAGNOSIS — G8929 Other chronic pain: Secondary | ICD-10-CM | POA: Diagnosis not present

## 2015-10-17 DIAGNOSIS — M25511 Pain in right shoulder: Secondary | ICD-10-CM | POA: Diagnosis not present

## 2015-10-19 DIAGNOSIS — G8929 Other chronic pain: Secondary | ICD-10-CM | POA: Diagnosis not present

## 2015-10-19 DIAGNOSIS — M25511 Pain in right shoulder: Secondary | ICD-10-CM | POA: Diagnosis not present

## 2015-10-24 DIAGNOSIS — G8929 Other chronic pain: Secondary | ICD-10-CM | POA: Diagnosis not present

## 2015-10-24 DIAGNOSIS — M25511 Pain in right shoulder: Secondary | ICD-10-CM | POA: Diagnosis not present

## 2015-10-28 DIAGNOSIS — M25511 Pain in right shoulder: Secondary | ICD-10-CM | POA: Diagnosis not present

## 2015-10-28 DIAGNOSIS — G8929 Other chronic pain: Secondary | ICD-10-CM | POA: Diagnosis not present

## 2015-10-31 DIAGNOSIS — G8929 Other chronic pain: Secondary | ICD-10-CM | POA: Diagnosis not present

## 2015-10-31 DIAGNOSIS — M25511 Pain in right shoulder: Secondary | ICD-10-CM | POA: Diagnosis not present

## 2015-11-03 DIAGNOSIS — G8929 Other chronic pain: Secondary | ICD-10-CM | POA: Diagnosis not present

## 2015-11-03 DIAGNOSIS — M25511 Pain in right shoulder: Secondary | ICD-10-CM | POA: Diagnosis not present

## 2015-11-08 DIAGNOSIS — G8929 Other chronic pain: Secondary | ICD-10-CM | POA: Diagnosis not present

## 2015-11-08 DIAGNOSIS — M25511 Pain in right shoulder: Secondary | ICD-10-CM | POA: Diagnosis not present

## 2015-11-10 DIAGNOSIS — M25511 Pain in right shoulder: Secondary | ICD-10-CM | POA: Diagnosis not present

## 2015-11-10 DIAGNOSIS — G8929 Other chronic pain: Secondary | ICD-10-CM | POA: Diagnosis not present

## 2015-11-15 DIAGNOSIS — G8929 Other chronic pain: Secondary | ICD-10-CM | POA: Diagnosis not present

## 2015-11-15 DIAGNOSIS — M25511 Pain in right shoulder: Secondary | ICD-10-CM | POA: Diagnosis not present

## 2015-11-17 DIAGNOSIS — M25511 Pain in right shoulder: Secondary | ICD-10-CM | POA: Diagnosis not present

## 2015-11-17 DIAGNOSIS — G8929 Other chronic pain: Secondary | ICD-10-CM | POA: Diagnosis not present

## 2015-11-22 DIAGNOSIS — M25511 Pain in right shoulder: Secondary | ICD-10-CM | POA: Diagnosis not present

## 2015-11-22 DIAGNOSIS — G8929 Other chronic pain: Secondary | ICD-10-CM | POA: Diagnosis not present

## 2015-11-24 DIAGNOSIS — G8929 Other chronic pain: Secondary | ICD-10-CM | POA: Diagnosis not present

## 2015-11-24 DIAGNOSIS — M25511 Pain in right shoulder: Secondary | ICD-10-CM | POA: Diagnosis not present

## 2015-11-30 DIAGNOSIS — M25511 Pain in right shoulder: Secondary | ICD-10-CM | POA: Diagnosis not present

## 2015-11-30 DIAGNOSIS — G8929 Other chronic pain: Secondary | ICD-10-CM | POA: Diagnosis not present

## 2015-12-02 DIAGNOSIS — M25511 Pain in right shoulder: Secondary | ICD-10-CM | POA: Diagnosis not present

## 2015-12-02 DIAGNOSIS — G8929 Other chronic pain: Secondary | ICD-10-CM | POA: Diagnosis not present

## 2015-12-05 ENCOUNTER — Other Ambulatory Visit: Payer: Self-pay | Admitting: Surgery

## 2015-12-05 DIAGNOSIS — M75121 Complete rotator cuff tear or rupture of right shoulder, not specified as traumatic: Secondary | ICD-10-CM

## 2015-12-06 DIAGNOSIS — M25511 Pain in right shoulder: Secondary | ICD-10-CM | POA: Diagnosis not present

## 2015-12-06 DIAGNOSIS — G8929 Other chronic pain: Secondary | ICD-10-CM | POA: Diagnosis not present

## 2015-12-08 DIAGNOSIS — M25511 Pain in right shoulder: Secondary | ICD-10-CM | POA: Diagnosis not present

## 2015-12-08 DIAGNOSIS — G8929 Other chronic pain: Secondary | ICD-10-CM | POA: Diagnosis not present

## 2015-12-12 DIAGNOSIS — M25511 Pain in right shoulder: Secondary | ICD-10-CM | POA: Diagnosis not present

## 2015-12-12 DIAGNOSIS — G8929 Other chronic pain: Secondary | ICD-10-CM | POA: Diagnosis not present

## 2015-12-14 DIAGNOSIS — M25511 Pain in right shoulder: Secondary | ICD-10-CM | POA: Diagnosis not present

## 2015-12-14 DIAGNOSIS — G8929 Other chronic pain: Secondary | ICD-10-CM | POA: Diagnosis not present

## 2015-12-15 ENCOUNTER — Ambulatory Visit
Admission: RE | Admit: 2015-12-15 | Discharge: 2015-12-15 | Disposition: A | Discharge status: Home / Self Care | Payer: PPO | Source: Ambulatory Visit | Attending: Surgery | Admitting: Surgery

## 2015-12-15 DIAGNOSIS — M7551 Bursitis of right shoulder: Secondary | ICD-10-CM | POA: Diagnosis not present

## 2015-12-15 DIAGNOSIS — S4991XD Unspecified injury of right shoulder and upper arm, subsequent encounter: Secondary | ICD-10-CM | POA: Insufficient documentation

## 2015-12-15 DIAGNOSIS — M75121 Complete rotator cuff tear or rupture of right shoulder, not specified as traumatic: Secondary | ICD-10-CM | POA: Diagnosis not present

## 2015-12-15 DIAGNOSIS — Z9889 Other specified postprocedural states: Secondary | ICD-10-CM | POA: Diagnosis not present

## 2015-12-15 DIAGNOSIS — S4991XA Unspecified injury of right shoulder and upper arm, initial encounter: Secondary | ICD-10-CM | POA: Diagnosis not present

## 2015-12-15 DIAGNOSIS — M7581 Other shoulder lesions, right shoulder: Secondary | ICD-10-CM | POA: Diagnosis not present

## 2015-12-15 DIAGNOSIS — M25511 Pain in right shoulder: Secondary | ICD-10-CM | POA: Diagnosis not present

## 2015-12-19 DIAGNOSIS — G8929 Other chronic pain: Secondary | ICD-10-CM | POA: Diagnosis not present

## 2015-12-19 DIAGNOSIS — M25511 Pain in right shoulder: Secondary | ICD-10-CM | POA: Diagnosis not present

## 2015-12-22 DIAGNOSIS — G8929 Other chronic pain: Secondary | ICD-10-CM | POA: Diagnosis not present

## 2015-12-22 DIAGNOSIS — M25511 Pain in right shoulder: Secondary | ICD-10-CM | POA: Diagnosis not present

## 2015-12-26 DIAGNOSIS — S46101D Unspecified injury of muscle, fascia and tendon of long head of biceps, right arm, subsequent encounter: Secondary | ICD-10-CM | POA: Diagnosis not present

## 2015-12-26 DIAGNOSIS — M7581 Other shoulder lesions, right shoulder: Secondary | ICD-10-CM | POA: Diagnosis not present

## 2015-12-26 DIAGNOSIS — G8929 Other chronic pain: Secondary | ICD-10-CM | POA: Diagnosis not present

## 2015-12-26 DIAGNOSIS — M25511 Pain in right shoulder: Secondary | ICD-10-CM | POA: Diagnosis not present

## 2015-12-26 DIAGNOSIS — M75121 Complete rotator cuff tear or rupture of right shoulder, not specified as traumatic: Secondary | ICD-10-CM | POA: Diagnosis not present

## 2015-12-29 DIAGNOSIS — G8929 Other chronic pain: Secondary | ICD-10-CM | POA: Diagnosis not present

## 2015-12-29 DIAGNOSIS — M25511 Pain in right shoulder: Secondary | ICD-10-CM | POA: Diagnosis not present

## 2016-01-04 DIAGNOSIS — G8929 Other chronic pain: Secondary | ICD-10-CM | POA: Diagnosis not present

## 2016-01-04 DIAGNOSIS — M25511 Pain in right shoulder: Secondary | ICD-10-CM | POA: Diagnosis not present

## 2016-01-11 DIAGNOSIS — M25511 Pain in right shoulder: Secondary | ICD-10-CM | POA: Diagnosis not present

## 2016-01-11 DIAGNOSIS — G8929 Other chronic pain: Secondary | ICD-10-CM | POA: Diagnosis not present

## 2016-01-18 DIAGNOSIS — G8929 Other chronic pain: Secondary | ICD-10-CM | POA: Diagnosis not present

## 2016-01-18 DIAGNOSIS — M25511 Pain in right shoulder: Secondary | ICD-10-CM | POA: Diagnosis not present

## 2016-02-01 DIAGNOSIS — M25511 Pain in right shoulder: Secondary | ICD-10-CM | POA: Diagnosis not present

## 2016-02-01 DIAGNOSIS — G8929 Other chronic pain: Secondary | ICD-10-CM | POA: Diagnosis not present

## 2016-02-29 ENCOUNTER — Other Ambulatory Visit: Payer: Self-pay | Admitting: Infectious Diseases

## 2016-02-29 DIAGNOSIS — Z1159 Encounter for screening for other viral diseases: Secondary | ICD-10-CM | POA: Diagnosis not present

## 2016-02-29 DIAGNOSIS — F329 Major depressive disorder, single episode, unspecified: Secondary | ICD-10-CM | POA: Diagnosis not present

## 2016-02-29 DIAGNOSIS — Z1231 Encounter for screening mammogram for malignant neoplasm of breast: Secondary | ICD-10-CM | POA: Diagnosis not present

## 2016-02-29 DIAGNOSIS — Z1211 Encounter for screening for malignant neoplasm of colon: Secondary | ICD-10-CM | POA: Diagnosis not present

## 2016-02-29 DIAGNOSIS — Z1212 Encounter for screening for malignant neoplasm of rectum: Secondary | ICD-10-CM | POA: Diagnosis not present

## 2016-02-29 DIAGNOSIS — L989 Disorder of the skin and subcutaneous tissue, unspecified: Secondary | ICD-10-CM | POA: Diagnosis not present

## 2016-02-29 DIAGNOSIS — M810 Age-related osteoporosis without current pathological fracture: Secondary | ICD-10-CM | POA: Diagnosis not present

## 2016-02-29 DIAGNOSIS — E785 Hyperlipidemia, unspecified: Secondary | ICD-10-CM | POA: Diagnosis not present

## 2016-02-29 DIAGNOSIS — Z0000 Encounter for general adult medical examination without abnormal findings: Secondary | ICD-10-CM | POA: Diagnosis not present

## 2016-03-08 DIAGNOSIS — Z1212 Encounter for screening for malignant neoplasm of rectum: Secondary | ICD-10-CM | POA: Diagnosis not present

## 2016-03-08 DIAGNOSIS — Z1211 Encounter for screening for malignant neoplasm of colon: Secondary | ICD-10-CM | POA: Diagnosis not present

## 2016-03-14 DIAGNOSIS — H2513 Age-related nuclear cataract, bilateral: Secondary | ICD-10-CM | POA: Diagnosis not present

## 2016-04-06 ENCOUNTER — Ambulatory Visit
Admission: RE | Admit: 2016-04-06 | Discharge: 2016-04-06 | Disposition: A | Discharge status: Home / Self Care | Payer: PPO | Source: Ambulatory Visit | Attending: Infectious Diseases | Admitting: Infectious Diseases

## 2016-04-06 DIAGNOSIS — Z1231 Encounter for screening mammogram for malignant neoplasm of breast: Secondary | ICD-10-CM | POA: Insufficient documentation

## 2016-04-30 DIAGNOSIS — X32XXXA Exposure to sunlight, initial encounter: Secondary | ICD-10-CM | POA: Diagnosis not present

## 2016-04-30 DIAGNOSIS — L281 Prurigo nodularis: Secondary | ICD-10-CM | POA: Diagnosis not present

## 2016-04-30 DIAGNOSIS — L570 Actinic keratosis: Secondary | ICD-10-CM | POA: Diagnosis not present

## 2016-05-25 DIAGNOSIS — M1712 Unilateral primary osteoarthritis, left knee: Secondary | ICD-10-CM | POA: Diagnosis not present

## 2016-05-25 DIAGNOSIS — S83232A Complex tear of medial meniscus, current injury, left knee, initial encounter: Secondary | ICD-10-CM | POA: Diagnosis not present

## 2016-05-25 DIAGNOSIS — S66912A Strain of unspecified muscle, fascia and tendon at wrist and hand level, left hand, initial encounter: Secondary | ICD-10-CM | POA: Diagnosis not present

## 2016-10-23 DIAGNOSIS — L538 Other specified erythematous conditions: Secondary | ICD-10-CM | POA: Diagnosis not present

## 2016-10-23 DIAGNOSIS — L820 Inflamed seborrheic keratosis: Secondary | ICD-10-CM | POA: Diagnosis not present

## 2016-10-23 DIAGNOSIS — L298 Other pruritus: Secondary | ICD-10-CM | POA: Diagnosis not present

## 2016-10-23 DIAGNOSIS — X32XXXA Exposure to sunlight, initial encounter: Secondary | ICD-10-CM | POA: Diagnosis not present

## 2016-10-23 DIAGNOSIS — L570 Actinic keratosis: Secondary | ICD-10-CM | POA: Diagnosis not present

## 2016-11-12 DIAGNOSIS — M25532 Pain in left wrist: Secondary | ICD-10-CM | POA: Diagnosis not present

## 2016-11-12 DIAGNOSIS — S66912D Strain of unspecified muscle, fascia and tendon at wrist and hand level, left hand, subsequent encounter: Secondary | ICD-10-CM | POA: Diagnosis not present

## 2016-12-11 ENCOUNTER — Other Ambulatory Visit: Payer: Self-pay | Admitting: Surgery

## 2016-12-11 DIAGNOSIS — S66912D Strain of unspecified muscle, fascia and tendon at wrist and hand level, left hand, subsequent encounter: Secondary | ICD-10-CM

## 2016-12-17 ENCOUNTER — Ambulatory Visit
Admission: RE | Admit: 2016-12-17 | Discharge: 2016-12-17 | Disposition: A | Discharge status: Home / Self Care | Payer: PPO | Source: Ambulatory Visit | Attending: Surgery | Admitting: Surgery

## 2016-12-17 DIAGNOSIS — M7989 Other specified soft tissue disorders: Secondary | ICD-10-CM | POA: Diagnosis not present

## 2016-12-17 DIAGNOSIS — X58XXXA Exposure to other specified factors, initial encounter: Secondary | ICD-10-CM | POA: Diagnosis not present

## 2016-12-17 DIAGNOSIS — S66912D Strain of unspecified muscle, fascia and tendon at wrist and hand level, left hand, subsequent encounter: Secondary | ICD-10-CM | POA: Diagnosis not present

## 2016-12-17 DIAGNOSIS — M25432 Effusion, left wrist: Secondary | ICD-10-CM | POA: Insufficient documentation

## 2017-01-14 DIAGNOSIS — M24132 Other articular cartilage disorders, left wrist: Secondary | ICD-10-CM | POA: Diagnosis not present

## 2017-01-14 DIAGNOSIS — M19032 Primary osteoarthritis, left wrist: Secondary | ICD-10-CM | POA: Diagnosis not present

## 2017-01-14 DIAGNOSIS — S66912D Strain of unspecified muscle, fascia and tendon at wrist and hand level, left hand, subsequent encounter: Secondary | ICD-10-CM | POA: Diagnosis not present

## 2017-02-23 DIAGNOSIS — C801 Malignant (primary) neoplasm, unspecified: Secondary | ICD-10-CM

## 2017-02-23 HISTORY — DX: Malignant (primary) neoplasm, unspecified: C80.1

## 2017-02-26 DIAGNOSIS — C44729 Squamous cell carcinoma of skin of left lower limb, including hip: Secondary | ICD-10-CM | POA: Diagnosis not present

## 2017-02-26 DIAGNOSIS — D485 Neoplasm of uncertain behavior of skin: Secondary | ICD-10-CM | POA: Diagnosis not present

## 2017-02-26 DIAGNOSIS — L309 Dermatitis, unspecified: Secondary | ICD-10-CM | POA: Diagnosis not present

## 2017-02-26 DIAGNOSIS — L281 Prurigo nodularis: Secondary | ICD-10-CM | POA: Diagnosis not present

## 2017-02-26 DIAGNOSIS — S40811A Abrasion of right upper arm, initial encounter: Secondary | ICD-10-CM | POA: Diagnosis not present

## 2017-03-01 DIAGNOSIS — F329 Major depressive disorder, single episode, unspecified: Secondary | ICD-10-CM | POA: Diagnosis not present

## 2017-03-01 DIAGNOSIS — M810 Age-related osteoporosis without current pathological fracture: Secondary | ICD-10-CM | POA: Diagnosis not present

## 2017-03-01 DIAGNOSIS — Z0000 Encounter for general adult medical examination without abnormal findings: Secondary | ICD-10-CM | POA: Diagnosis not present

## 2017-03-01 DIAGNOSIS — E785 Hyperlipidemia, unspecified: Secondary | ICD-10-CM | POA: Diagnosis not present

## 2017-03-20 ENCOUNTER — Other Ambulatory Visit: Payer: Self-pay

## 2017-03-20 ENCOUNTER — Encounter
Admission: RE | Admit: 2017-03-20 | Discharge: 2017-03-20 | Disposition: A | Discharge status: Home / Self Care | Payer: PPO | Source: Ambulatory Visit | Attending: Surgery | Admitting: Surgery

## 2017-03-20 DIAGNOSIS — Z01818 Encounter for other preprocedural examination: Secondary | ICD-10-CM | POA: Diagnosis not present

## 2017-03-20 DIAGNOSIS — I341 Nonrheumatic mitral (valve) prolapse: Secondary | ICD-10-CM | POA: Insufficient documentation

## 2017-03-20 DIAGNOSIS — Z01810 Encounter for preprocedural cardiovascular examination: Secondary | ICD-10-CM | POA: Diagnosis not present

## 2017-03-20 HISTORY — DX: Malignant (primary) neoplasm, unspecified: C80.1

## 2017-03-20 HISTORY — DX: Adverse effect of unspecified anesthetic, initial encounter: T41.45XA

## 2017-03-20 HISTORY — DX: Other complications of anesthesia, initial encounter: T88.59XA

## 2017-03-20 NOTE — Patient Instructions (Signed)
  Your procedure is scheduled JJ:[id number] Jan. 3 , 2019. Report to Same Day Surgery. To find out your arrival time please call [phone] between 1PM - 3PM on Wednesday Jan. 2, 2019 .  Remember: Instructions that are not followed completely may result in serious medical risk, up to and including death, or upon the discretion of your surgeon and anesthesiologist your surgery may need to be rescheduled.    _x___ 1. Do not eat food after midnight night prior to surgery.     No gum chewing or hard candies, snacks or breakfast.     May drink the following:      water      Gatorade     clear apple juice      black coffee      or black tea      ____ 2. No Alcohol for 24 hours before or after surgery.   ____ 3. Bring all medications with you on the day of surgery if instructed.    __x__ 4. Notify your doctor if there is any change in your medical condition     (cold, fever, infections).    _____ 5.   Do Not Smoke or use e-cigarettes For 24 Hours Prior to Your   Surgery.  Do not use any chewable tobacco products for at least 6   hours prior to  surgery.                  Do not wear jewelry, make-up, hairpins, clips or nail polish.  Do not wear lotions, powders, or perfumes.   Do not shave 48 hours prior to surgery. Men may shave face and neck.  Do not bring valuables to the hospital.    [facility] is not responsible for any belongings or valuables.               Contacts, dentures or bridgework may not be worn into surgery.  Leave your suitcase in the car. After surgery it may be brought to your room.  For patients admitted to the hospital, discharge time is determined by your  treatment team.   Patients discharged the day of surgery will not be allowed to drive home.    Please read over the following fact sheets that you were given:   [facility] Preparing for Surgery             _x___ Take these medicines the morning of surgery with A SIP OF WATER:    1. citalopram  (CELEXA)       ____ Fleet Enema (as directed)   _x_ Use CHG Soap as directed on instruction sheet  ____ Use inhalers on the day of surgery and bring to hospital day of surgery  ____ Stop metformin 2 days prior to surgery    ____ Take 1/2 of usual insulin dose the night before surgery and none on the morning of          surgery.   ____ Stop Eliquis/Coumadin/Plavix/aspirin on does not apply  __x__ Stop Anti-inflammatories such as Advil, Aleve, Ibuprofen, Motrin, Naproxen,  Naprosyn, Goodies powders or aspirin products. OK to take Tylenol.   ____ Stop supplements until after surgery.    ____ Bring C-Pap to the hospital.

## 2017-03-27 MED ORDER — CEFAZOLIN SODIUM-DEXTROSE 2-4 GM/100ML-% IV SOLN
2.0000 g | Freq: Once | INTRAVENOUS | Status: AC
  Administered 2017-03-28: 2 g via INTRAVENOUS

## 2017-03-28 ENCOUNTER — Ambulatory Visit: Payer: PPO | Admitting: Anesthesiology

## 2017-03-28 ENCOUNTER — Encounter
Admission: RE | Disposition: A | Discharge status: Home / Self Care | Payer: Self-pay | Source: Ambulatory Visit | Attending: Surgery

## 2017-03-28 ENCOUNTER — Encounter: Payer: Self-pay | Admitting: Surgery

## 2017-03-28 ENCOUNTER — Ambulatory Visit
Admission: RE | Admit: 2017-03-28 | Discharge: 2017-03-28 | Disposition: A | Discharge status: Home / Self Care | Payer: PPO | Source: Ambulatory Visit | Attending: Surgery | Admitting: Surgery

## 2017-03-28 DIAGNOSIS — R42 Dizziness and giddiness: Secondary | ICD-10-CM | POA: Diagnosis not present

## 2017-03-28 DIAGNOSIS — F329 Major depressive disorder, single episode, unspecified: Secondary | ICD-10-CM | POA: Insufficient documentation

## 2017-03-28 DIAGNOSIS — S66912A Strain of unspecified muscle, fascia and tendon at wrist and hand level, left hand, initial encounter: Secondary | ICD-10-CM | POA: Diagnosis not present

## 2017-03-28 DIAGNOSIS — E785 Hyperlipidemia, unspecified: Secondary | ICD-10-CM | POA: Insufficient documentation

## 2017-03-28 DIAGNOSIS — Z79899 Other long term (current) drug therapy: Secondary | ICD-10-CM | POA: Insufficient documentation

## 2017-03-28 DIAGNOSIS — S63592A Other specified sprain of left wrist, initial encounter: Secondary | ICD-10-CM | POA: Insufficient documentation

## 2017-03-28 DIAGNOSIS — X58XXXA Exposure to other specified factors, initial encounter: Secondary | ICD-10-CM | POA: Insufficient documentation

## 2017-03-28 HISTORY — PX: WRIST ARTHROSCOPY WITH FOVEAL TRIANGULAR FIBROCARTILAGE COMPLEX REPAIR: SHX6403

## 2017-03-28 SURGERY — WRIST ARTHROSCOPY WITH FOVEAL TRIANGULAR FIBROCARTILAGE COMPLEX REPAIR
Anesthesia: General | Site: Wrist | Laterality: Left | Wound class: Clean

## 2017-03-28 MED ORDER — PHENYLEPHRINE HCL 10 MG/ML IJ SOLN
INTRAMUSCULAR | Status: DC | PRN
  Administered 2017-03-28 (×3): 50 ug via INTRAVENOUS

## 2017-03-28 MED ORDER — ACETAMINOPHEN 10 MG/ML IV SOLN
INTRAVENOUS | Status: DC | PRN
  Administered 2017-03-28: 1000 mg via INTRAVENOUS

## 2017-03-28 MED ORDER — ONDANSETRON HCL 4 MG/2ML IJ SOLN: 4.0000 mg | Freq: Once | INTRAMUSCULAR | Status: DC | PRN

## 2017-03-28 MED ORDER — LIDOCAINE HCL (PF) 2 % IJ SOLN
INTRAMUSCULAR | Status: AC
  Filled 2017-03-28: qty 10

## 2017-03-28 MED ORDER — FENTANYL CITRATE (PF) 100 MCG/2ML IJ SOLN
INTRAMUSCULAR | Status: AC
  Filled 2017-03-28: qty 4

## 2017-03-28 MED ORDER — KETOROLAC TROMETHAMINE 30 MG/ML IJ SOLN
INTRAMUSCULAR | Status: DC | PRN
  Administered 2017-03-28: 15 mg via INTRAVENOUS

## 2017-03-28 MED ORDER — OXYCODONE HCL 5 MG PO TABS: 5.0000 mg | ORAL_TABLET | ORAL | 0 refills | Status: DC | PRN

## 2017-03-28 MED ORDER — LACTATED RINGERS IV SOLN
INTRAVENOUS | Status: DC
  Administered 2017-03-28: 09:00:00 via INTRAVENOUS

## 2017-03-28 MED ORDER — KETOROLAC TROMETHAMINE 30 MG/ML IJ SOLN
INTRAMUSCULAR | Status: AC
  Filled 2017-03-28: qty 1

## 2017-03-28 MED ORDER — BUPIVACAINE-EPINEPHRINE (PF) 0.5% -1:200000 IJ SOLN
INTRAMUSCULAR | Status: AC
  Filled 2017-03-28: qty 30

## 2017-03-28 MED ORDER — FAMOTIDINE 20 MG PO TABS
20.0000 mg | ORAL_TABLET | Freq: Once | ORAL | Status: AC
  Administered 2017-03-28: 20 mg via ORAL

## 2017-03-28 MED ORDER — ROCURONIUM BROMIDE 100 MG/10ML IV SOLN
INTRAVENOUS | Status: DC | PRN
  Administered 2017-03-28: 1 mg via INTRAVENOUS

## 2017-03-28 MED ORDER — ACETAMINOPHEN 10 MG/ML IV SOLN
INTRAVENOUS | Status: AC
  Filled 2017-03-28: qty 100

## 2017-03-28 MED ORDER — FENTANYL CITRATE (PF) 100 MCG/2ML IJ SOLN: 25.0000 ug | INTRAMUSCULAR | Status: DC | PRN

## 2017-03-28 MED ORDER — CEFAZOLIN SODIUM-DEXTROSE 2-4 GM/100ML-% IV SOLN
INTRAVENOUS | Status: AC
  Filled 2017-03-28: qty 100

## 2017-03-28 MED ORDER — LIDOCAINE HCL (CARDIAC) 20 MG/ML IV SOLN
INTRAVENOUS | Status: DC | PRN
  Administered 2017-03-28: 100 mg via INTRAVENOUS

## 2017-03-28 MED ORDER — FAMOTIDINE 20 MG PO TABS
ORAL_TABLET | ORAL | Status: AC
  Filled 2017-03-28: qty 1

## 2017-03-28 MED ORDER — FENTANYL CITRATE (PF) 100 MCG/2ML IJ SOLN
INTRAMUSCULAR | Status: DC | PRN
  Administered 2017-03-28: 50 ug via INTRAVENOUS

## 2017-03-28 MED ORDER — PROPOFOL 10 MG/ML IV BOLUS
INTRAVENOUS | Status: DC | PRN
  Administered 2017-03-28: 100 mg via INTRAVENOUS

## 2017-03-28 MED ORDER — ONDANSETRON HCL 4 MG/2ML IJ SOLN
INTRAMUSCULAR | Status: DC | PRN
  Administered 2017-03-28: 4 mg via INTRAVENOUS

## 2017-03-28 MED ORDER — BUPIVACAINE-EPINEPHRINE (PF) 0.5% -1:200000 IJ SOLN
INTRAMUSCULAR | Status: DC | PRN
  Administered 2017-03-28: 10 mL via PERINEURAL

## 2017-03-28 MED ORDER — PROPOFOL 10 MG/ML IV BOLUS
INTRAVENOUS | Status: AC
  Filled 2017-03-28: qty 20

## 2017-03-28 MED ORDER — GLYCOPYRROLATE 0.2 MG/ML IJ SOLN
INTRAMUSCULAR | Status: DC | PRN
  Administered 2017-03-28: 0.2 mg via INTRAVENOUS

## 2017-03-28 MED ORDER — EPHEDRINE SULFATE 50 MG/ML IJ SOLN
INTRAMUSCULAR | Status: DC | PRN
  Administered 2017-03-28: 10 mg via INTRAVENOUS

## 2017-03-28 SURGICAL SUPPLY — 32 items
BANDAGE ACE 4X5 VEL STRL LF (GAUZE/BANDAGES/DRESSINGS) ×4 IMPLANT
BLADE ABRADER 2.9 (BLADE) IMPLANT
BLADE FULL RADIUS 2.9 (BLADE) ×2 IMPLANT
BLADE SHAVER 2.9D 7 MINI (BLADE) ×2 IMPLANT
BNDG COHESIVE 4X5 TAN STRL (GAUZE/BANDAGES/DRESSINGS) ×2 IMPLANT
BNDG ESMARK 4X12 TAN STRL LF (GAUZE/BANDAGES/DRESSINGS) ×2 IMPLANT
CANISTER SUCT 1200ML W/VALVE (MISCELLANEOUS) ×2 IMPLANT
CAST PADDING 3X4FT ST 30246 (SOFTGOODS) ×1
CHLORAPREP W/TINT 26ML (MISCELLANEOUS) ×2 IMPLANT
CUFF TOURN 18 STER (MISCELLANEOUS) ×2 IMPLANT
DRAIN PENROSE 1/4X12 LTX (DRAIN) ×2 IMPLANT
DRAPE SURG 17X11 SM STRL (DRAPES) ×2 IMPLANT
GAUZE PETRO XEROFOAM 1X8 (MISCELLANEOUS) ×2 IMPLANT
GAUZE SPONGE 4X4 12PLY STRL (GAUZE/BANDAGES/DRESSINGS) ×2 IMPLANT
GLOVE BIO SURGEON STRL SZ8 (GLOVE) ×2 IMPLANT
GLOVE INDICATOR 8.0 STRL GRN (GLOVE) ×2 IMPLANT
GOWN STRL REUS W/ TWL LRG LVL3 (GOWN DISPOSABLE) ×1 IMPLANT
GOWN STRL REUS W/ TWL XL LVL3 (GOWN DISPOSABLE) ×1 IMPLANT
GOWN STRL REUS W/TWL LRG LVL3 (GOWN DISPOSABLE) ×1
GOWN STRL REUS W/TWL XL LVL3 (GOWN DISPOSABLE) ×1
IV LACTATED RINGER IRRG 3000ML (IV SOLUTION) ×1
IV LR IRRIG 3000ML ARTHROMATIC (IV SOLUTION) ×1 IMPLANT
KIT RM TURNOVER STRD PROC AR (KITS) ×2 IMPLANT
NS IRRIG 500ML POUR BTL (IV SOLUTION) ×2 IMPLANT
PACK EXTREMITY ARMC (MISCELLANEOUS) ×2 IMPLANT
PAD CAST CTTN 3X4 STRL (SOFTGOODS) ×1 IMPLANT
SET PASSER SUT MMII (SUTURE) ×2 IMPLANT
STOCKINETTE IMPERVIOUS 9X36 MD (GAUZE/BANDAGES/DRESSINGS) IMPLANT
SUT PROLENE 2 0 FS (SUTURE) ×4 IMPLANT
SUT PROLENE 4 0 PS 2 18 (SUTURE) ×2 IMPLANT
TRAP DIGIT (INSTRUMENTS) ×2 IMPLANT
TUBING ARTHRO INFLOW-ONLY STRL (TUBING) ×2 IMPLANT

## 2017-03-28 NOTE — Transfer of Care (Signed)
Immediate Anesthesia Transfer of Care Note  Patient: Sabrina Burns  Procedure(s) Performed: WRIST ARTHROSCOPY WITH DEBRIDEMENT AND REPAIR OF TFCC TEAR (Left Wrist)  Patient Location: PACU  Anesthesia Type:General  Level of Consciousness: awake and patient cooperative  Airway & Oxygen Therapy: Patient Spontanous Breathing and Patient connected to nasal cannula oxygen  Post-op Assessment: Report given to RN and Post -op Vital signs reviewed and stable  Post vital signs: Reviewed and stable  Last Vitals:  Vitals:   03/28/17 0841  BP: 124/82  Pulse: 72  Resp: 17  Temp: 36.7 C  SpO2: 100%    Last Pain:  Vitals:   03/28/17 0841  TempSrc: Oral  PainSc: 1          Complications: No apparent anesthesia complications

## 2017-03-28 NOTE — Discharge Instructions (Addendum)
Keep splint dry and intact. Keep hand elevated above heart level. Apply ice to affected area frequently. Take Aleve 2 tablets BID with meals for 7-10 days, then as necessary. Take pain medication as prescribed or ES Tylenol when needed.  Return for follow-up in 10-14 days or as scheduled.  AMBULATORY SURGERY  DISCHARGE INSTRUCTIONS   1) The drugs that you were given will stay in your system until tomorrow so for the next 24 hours you should not:  A) Drive an automobile B) Make any legal decisions C) Drink any alcoholic beverage   2) You may resume regular meals tomorrow.  Today it is better to start with liquids and gradually work up to solid foods.  You may eat anything you prefer, but it is better to start with liquids, then soup and crackers, and gradually work up to solid foods.   3) Please notify your doctor immediately if you have any unusual bleeding, trouble breathing, redness and pain at the surgery site, drainage, fever, or pain not relieved by medication.    4) Additional Instructions:        Please contact your physician with any problems or Same Day Surgery at [phone], Monday through Friday 6 am to 4 pm, or [place] [facility] number at [phone].

## 2017-03-28 NOTE — Anesthesia Preprocedure Evaluation (Signed)
Anesthesia Evaluation  Patient identified by MRN, date of birth, ID band Patient awake    Reviewed: Allergy & Precautions, NPO status , Patient's Chart, lab work & pertinent test results  History of Anesthesia Complications (+) PROLONGED EMERGENCE and history of anesthetic complications (memory difficulties)  Airway Mallampati: II       Dental   Pulmonary neg sleep apnea, neg COPD,           Cardiovascular (-) hypertension(-) Past MI and (-) CHF (-) dysrhythmias (-) Valvular Problems/Murmurs     Neuro/Psych neg Seizures Depression    GI/Hepatic Neg liver ROS, neg GERD  ,  Endo/Other  neg diabetes  Renal/GU negative Renal ROS     Musculoskeletal   Abdominal   Peds  Hematology   Anesthesia Other Findings   Reproductive/Obstetrics                             Anesthesia Physical Anesthesia Plan  ASA: II  Anesthesia Plan: General   Post-op Pain Management:    Induction: Intravenous  PONV Risk Score and Plan: 3 and Dexamethasone, Ondansetron and Treatment may vary due to age or medical condition  Airway Management Planned: LMA  Additional Equipment:   Intra-op Plan:   Post-operative Plan:   Informed Consent: I have reviewed the patients History and Physical, chart, labs and discussed the procedure including the risks, benefits and alternatives for the proposed anesthesia with the patient or authorized representative who has indicated his/her understanding and acceptance.     Plan Discussed with:   Anesthesia Plan Comments:         Anesthesia Quick Evaluation

## 2017-03-28 NOTE — Progress Notes (Signed)
Pt states that she is dizzy

## 2017-03-28 NOTE — Anesthesia Procedure Notes (Signed)
Procedure Name: LMA Insertion Date/Time: 03/28/2017 10:25 AM Performed by: Bernardo Heater, CRNA Patient Re-evaluated:Patient Re-evaluated prior to induction Oxygen Delivery Method: Circle system utilized Preoxygenation: Pre-oxygenation with 100% oxygen Induction Type: IV induction Number of attempts: 1 Placement Confirmation: positive ETCO2 and breath sounds checked- equal and bilateral Tube secured with: Tape Dental Injury: Teeth and Oropharynx as per pre-operative assessment

## 2017-03-28 NOTE — Op Note (Signed)
03/28/2017  12:17 PM  Patient:   Sabrina Burns  Pre-Op Diagnosis:   TFCC tear, left wrist.  Post-Op Diagnosis:   Same.  Procedure:   Arthroscopic debridement with arthroscopically-assisted TFCC repair, left wrist.  Surgeon:   Pascal Lux, MD  Assistant:   Cameron Proud, PA-C  Anesthesia:   General LMA  Findings:   As above. There was a dorso-ulnar tear of the TFCC, but the deeper portion of the TFCC appeared to be intact to probing. There were mild degenerative changes noted involving the dorsal ulnar aspect of the triquetrum, as well as some degenerative fraying of the central portion of the scapholunate ligament without instability.  Complications:   None  Fluids:   600 cc crystalloid  EBL:   2 cc  UOP:   None  TT:   None  Closure:   4-0 Prolene interrupted sutures  Brief Clinical Note:   The patient is a 72 year old female with a long history of ulnar-sided left wrist pain. Her symptoms have persisted despite medications, activity modification, splinting, injections. Her history and examination are suspicious for TFCC tear. This was confirmed by an arthro-MRI scan. The patient presents at this time for arthroscopy and debridement with possible TFCC repair of her left wrist.  Procedure:   The patient was brought into the operating room and laid in the supine position. After adequate general laryngeal mask anesthesia was obtained, the patient's left upper extremity was prepped with ChloraPrep solution before being draped sterilely. Preoperative antibiotics were administered. A timeout was performed to verify the appropriate surgical site before the left wrist was injected sterilely using 10 cc of 0.5% Sensorcaine with epinephrine. The arm was strapped to the [employer] wrist arthroscopy tower and approximate 10 pounds of a distractor force applied across the wrist. The 3-4 portal was created by making a small transverse nick in the skin then bluntly dissecting down through  the subcutaneous tissues before puncturing the capsule with a blunt trocar. The camera was inserted. Under arthroscopic visualization, the 6R portal was created similarly. The joint was thoroughly inspected with the findings as noted above. Utilizing the 2.9 full-radius resector, areas of synovitis were debrided off the Y-ligament volarly as well as dorso-ulnarly in the area of the tear to better visualize the tear. The tear itself also was lightly abraded using the full-radius resector to stimulate healing.    A short longitudinal incision was made approximately 1 cm proximal to the 6R portal site to function as a working portal. The TFCC was repaired using several 2-0 Prolene interrupted sutures. The [facility] anterior meniscal repair set was used to pass and retrieve the sutures. The sutures were retrieved through the more distal incision after dissecting subcutaneously and tied securely to complete the repair. Following the repair, the TFCC was probed and demonstrated good trampoline effect.  The instruments were removed from the joint after suctioning the excess fluid. The portal sites were repaired using 4-0 Prolene interrupted sutures before a sterile bulky dressing was applied to the wrist. The arm was placed into a sugar tong splint maintaining the wrist in neutral position and the forearm in neutral rotation. The patient was then awakened, extubated, and returned to the recovery room in satisfactory condition after tolerating the procedure well.

## 2017-03-28 NOTE — Anesthesia Post-op Follow-up Note (Signed)
Anesthesia QCDR form completed.        

## 2017-03-28 NOTE — Progress Notes (Signed)
Dizziness better

## 2017-03-28 NOTE — H&P (Signed)
Paper H&P to be scanned into permanent record. H&P reviewed and patient re-examined. No changes. 

## 2017-03-28 NOTE — Anesthesia Postprocedure Evaluation (Signed)
Anesthesia Post Note  Patient: Sabrina Burns  Procedure(s) Performed: WRIST ARTHROSCOPY WITH DEBRIDEMENT AND REPAIR OF TFCC TEAR (Left Wrist)  Patient location during evaluation: PACU Anesthesia Type: General Level of consciousness: awake and alert Pain management: pain level controlled Vital Signs Assessment: post-procedure vital signs reviewed and stable Respiratory status: spontaneous breathing and respiratory function stable Cardiovascular status: stable Anesthetic complications: no     Last Vitals:  Vitals:   03/28/17 1224 03/28/17 1226  BP:  119/63  Pulse: 82 78  Resp: 15 12  Temp:    SpO2: 100% 98%    Last Pain:  Vitals:   03/28/17 0841  TempSrc: Oral  PainSc: 1                  Narcissus Detwiler K

## 2017-04-08 DIAGNOSIS — Z9889 Other specified postprocedural states: Secondary | ICD-10-CM | POA: Diagnosis not present

## 2017-04-08 DIAGNOSIS — M24132 Other articular cartilage disorders, left wrist: Secondary | ICD-10-CM | POA: Diagnosis not present

## 2017-04-16 DIAGNOSIS — L905 Scar conditions and fibrosis of skin: Secondary | ICD-10-CM | POA: Diagnosis not present

## 2017-04-16 DIAGNOSIS — C44729 Squamous cell carcinoma of skin of left lower limb, including hip: Secondary | ICD-10-CM | POA: Diagnosis not present

## 2017-05-10 DIAGNOSIS — S63592D Other specified sprain of left wrist, subsequent encounter: Secondary | ICD-10-CM | POA: Diagnosis not present

## 2017-05-15 ENCOUNTER — Other Ambulatory Visit: Payer: Self-pay

## 2017-05-15 ENCOUNTER — Ambulatory Visit: Payer: PPO | Attending: Surgery | Admitting: Occupational Therapy

## 2017-05-15 DIAGNOSIS — M25532 Pain in left wrist: Secondary | ICD-10-CM | POA: Diagnosis not present

## 2017-05-15 DIAGNOSIS — M6281 Muscle weakness (generalized): Secondary | ICD-10-CM | POA: Diagnosis not present

## 2017-05-15 DIAGNOSIS — M25642 Stiffness of left hand, not elsewhere classified: Secondary | ICD-10-CM | POA: Insufficient documentation

## 2017-05-15 DIAGNOSIS — L905 Scar conditions and fibrosis of skin: Secondary | ICD-10-CM

## 2017-05-15 DIAGNOSIS — M25632 Stiffness of left wrist, not elsewhere classified: Secondary | ICD-10-CM | POA: Insufficient documentation

## 2017-05-15 NOTE — Therapy (Signed)
[facility] [address], [place], [address] Phone: [phone]   Fax:  [phone]  Occupational Therapy Evaluation  Patient Details  Name: Sabrina Burns MRN: [id number] Date of Birth: 10-06-1945 Referring Provider: Roland Rack   Encounter Date: 05/15/2017  OT End of Session - 05/15/17 1552    Visit Number  1    Number of Visits  10    Date for OT Re-Evaluation  06/19/17    OT Start Time  1325    OT Stop Time  1413    OT Time Calculation (min)  48 min    Activity Tolerance  Patient tolerated treatment well    Behavior During Therapy  [facility] for tasks assessed/performed       Past Medical History:  Diagnosis Date  . Cancer ([place]) 02/2017   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  . Complication of anesthesia    hypotension  with bladder surgery.  . Depression   . H/O: rheumatic fever   . Hypercholesteremia unkn  . Mitral valve prolapse   . Osteoporosis     Past Surgical History:  Procedure Laterality Date  . ABDOMINAL HYSTERECTOMY    . BACK SURGERY     lumbar  . COLONOSCOPY    . FOOT SURGERY Right   . pelvic sling    . SHOULDER ARTHROSCOPY WITH OPEN ROTATOR CUFF REPAIR Right 09/15/2015   Procedure: SHOULDER ARTHROSCOPY WITH OPEN ROTATOR CUFF REPAIR;  Surgeon: Corky Mull, MD;  Location: [facility];  Service: Orthopedics;  Laterality: Right;  . TONSILLECTOMY    . TUBAL LIGATION    . WRIST ARTHROSCOPY WITH FOVEAL TRIANGULAR FIBROCARTILAGE COMPLEX REPAIR Left 03/28/2017   Procedure: WRIST ARTHROSCOPY WITH DEBRIDEMENT AND REPAIR OF TFCC TEAR;  Surgeon: Corky Mull, MD;  Location: [facility];  Service: Orthopedics;  Laterality: Left;    There were no vitals filed for this visit.  Subjective Assessment - 05/15/17 1541    Subjective   I had issues with my wrist for about year- pain and burning pain - but constant - since surgey has been in brace all the time except bathing /dressing - if take of my splint and more  my wrist pain can go up to 15/10    Patient Stated Goals  Want the pain better in my wrist  so I can use it - can't do anything with it - only my fingers     Currently in Pain?  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     Leisure  Likes to do sewing, crochet, knit, cross stitch -  use tablet        AROM   Left Forearm Pronation  -- WNL but popping    Left Forearm Supination  50 Degrees popping go back into pronation     Left Wrist Extension  40 Degrees    Left Wrist Flexion  15 Degrees pain     Left Wrist Radial Deviation  7 Degrees    Left Wrist Ulnar Deviation  20 Degrees pain        FLuidotherapy done with AROM for wrist in all planes - to decrease pain and increase ROM  Hand out provided and review     Cont with wrist splint  Contrast  AROM pain free range for wrist ext, flexion , RD, UD , pronation and supination  Tendon glides  Stop before pain starts  2-3 x day             OT Education - 05/15/17 1552    Education provided  Yes    Education Details  Findings of eval and HEP     Person(s) Educated  Patient    Methods  Explanation;Demonstration;Tactile cues;Verbal cues;Handout    Comprehension  Verbalized understanding;Returned demonstration       OT Short Term Goals - 05/15/17 1725      OT SHORT TERM GOAL #1   Title  Pain on PRWHE improve with  more than 20 points     Baseline  Pain at eval on PRHWE 42/50     Time  3    Period  Weeks    Status  New    Target Date  06/05/17      OT SHORT TERM GOAL #2   Title  Pt to be ind in HEP for decrease pain , increase ROM , and strenght to be wean out of splint     Baseline  Splint all the time except ADL's  - did not do any HEP yet     Time  3    Period  Weeks    Status  New    Target Date  06/05/17        OT Long Term Goals - 05/15/17 1728      OT LONG TERM GOAL #1   Title  L wrist AROM improve with more than 10-20 degrees without increase symptoms of pain and popping     Baseline  Popping with pronation , wrist RD, UD, flexion and extnetion and sup impaired - see flowsheet     Time  6    Period  Weeks    Status  New    Target Date  06/26/17      OT LONG TERM GOAL #2   Title  L Wrist strength increase to 4/5 m/m to be able to use hand and wris in ADL's more than 75% without increase symptoms     Baseline  no strength yet- started AROM pain free this date- see flowsheet for ROM , using only 37 %     Time  6    Period  Weeks    Status  New    Target Date  06/26/17      OT LONG TERM GOAL #3   Title  L grip strenght to be more than 50% compare to R hand to cut food, pick up more than 3 lbs without symptoms , cut food, turn doorknob     Baseline  6 wks out - NT yet -  only AROM painfree started this date  Time  6    Period  Weeks    Status  New    Target Date  06/26/17            Plan - 05/15/17 1554    Clinical Impression Statement  Pt present  at OT eval about 7 wks s/p arthroscopy for TFCC repair on L wrist - pt show some sitiffness and pain in digits flexion and extnetion - and wrist AROM in all planes - pain can increase to 10/10 per pt and at rest in splint about 0/10 - pt do show decrease ROM in all planes and popping in pronation  - when head of ulnar stabilize for few reps - popping stops - and then starts again after few reps - pt report she had it since  after surgery - and no pain associated with it -incrase pain /edema and stiffness limiting her functional use of L hand in ADL's and IADl's     Occupational performance deficits (Please refer to evaluation for details):  ADL's;IADL's;Work;Play;Leisure    Rehab Potential  Good    OT Frequency  2x / week    OT Duration  4 weeks    OT Treatment/Interventions  Self-care/ADL training;Splinting;Fluidtherapy;Therapeutic exercise;Scar mobilization;Paraffin;Manual Therapy;Passive range of motion;Cryotherapy    Plan  assess progress with HEP doing ROM at home without increase pain -and if still popping with pronation     Clinical Decision Making  Several treatment options, min-mod task modification necessary    OT Home Exercise Plan  see pt instruction     Consulted and Agree with Plan of Care  Patient       Patient will benefit from skilled therapeutic intervention in order to improve the following deficits and impairments:  Pain, Impaired flexibility, Increased edema  Visit Diagnosis: Pain in left wrist - Plan: Ot plan of care cert/re-cert  Stiffness of left wrist, not elsewhere classified - Plan: Ot plan of care cert/re-cert  Stiffness of left hand, not elsewhere classified - Plan: Ot plan of care cert/re-cert  Muscle weakness (generalized) - Plan: Ot plan of care cert/re-cert  Scar tissue - Plan: Ot plan of care cert/re-cert    Problem List There are no active problems to display for this patient.   Sabrina Burns OTR/L,CLT 05/15/2017, 5:35 PM  [facility] [address], [place], [address] Phone: [phone]   Fax:  [phone]  Name: Sabrina Burns MRN: [id number] Date of Birth: Jun 09, 1945

## 2017-05-15 NOTE — Patient Instructions (Signed)
Cont with wrist splint  Contrast  AROM pain free range for wrist ext, flexion , RD, UD , pronation and supination  Stop before pain starts  2-3 x day

## 2017-05-21 ENCOUNTER — Ambulatory Visit: Payer: PPO | Admitting: Occupational Therapy

## 2017-05-21 DIAGNOSIS — M25642 Stiffness of left hand, not elsewhere classified: Secondary | ICD-10-CM

## 2017-05-21 DIAGNOSIS — M25532 Pain in left wrist: Secondary | ICD-10-CM | POA: Diagnosis not present

## 2017-05-21 DIAGNOSIS — L905 Scar conditions and fibrosis of skin: Secondary | ICD-10-CM

## 2017-05-21 DIAGNOSIS — M6281 Muscle weakness (generalized): Secondary | ICD-10-CM

## 2017-05-21 DIAGNOSIS — M25632 Stiffness of left wrist, not elsewhere classified: Secondary | ICD-10-CM

## 2017-05-21 NOTE — Therapy (Signed)
[facility] [address] [place], [address] Phone: [phone]   Fax:  [phone]  Occupational Therapy Treatment  Patient Details  Name: Sabrina Burns MRN: [id number] Date of Birth: Feb 05, 1946 Referring Provider: Roland Rack   Encounter Date: 05/21/2017  OT End of Session - 05/21/17 1510    Visit Number  2    Number of Visits  10    Date for OT Re-Evaluation  06/19/17    OT Start Time  0915    OT Stop Time  1001    OT Time Calculation (min)  46 min    Activity Tolerance  Patient tolerated treatment well    Behavior During Therapy  [facility] for tasks assessed/performed       Past Medical History:  Diagnosis Date  . Cancer ([place]) 02/2017   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  . Complication of anesthesia    hypotension  with bladder surgery.  . Depression   . H/O: rheumatic fever   . Hypercholesteremia unkn  . Mitral valve prolapse   . Osteoporosis     Past Surgical History:  Procedure Laterality Date  . ABDOMINAL HYSTERECTOMY    . BACK SURGERY     lumbar  . COLONOSCOPY    . FOOT SURGERY Right   . pelvic sling    . SHOULDER ARTHROSCOPY WITH OPEN ROTATOR CUFF REPAIR Right 09/15/2015   Procedure: SHOULDER ARTHROSCOPY WITH OPEN ROTATOR CUFF REPAIR;  Surgeon: Corky Mull, MD;  Location: [facility];  Service: Orthopedics;  Laterality: Right;  . TONSILLECTOMY    . TUBAL LIGATION    . WRIST ARTHROSCOPY WITH FOVEAL TRIANGULAR FIBROCARTILAGE COMPLEX REPAIR Left 03/28/2017   Procedure: WRIST ARTHROSCOPY WITH DEBRIDEMENT AND REPAIR OF TFCC TEAR;  Surgeon: Corky Mull, MD;  Location: [facility];  Service: Orthopedics;  Laterality: Left;    There were no vitals filed for this visit.  Subjective Assessment - 05/21/17 1508    Subjective   Doing okay - still pain when moving it - popping still there with rotation every time and some pain  now - about 3-4/10 - still wearing splint     Patient Stated Goals  Want the pain  better in my wrist  so I can use it - can't do anything with it - only my fingers     Currently in Pain?  Yes    Pain Score  4     Pain Location  Wrist    Pain Orientation  Left    Pain Descriptors / Indicators  -- popping pain     Pain Type  Surgical pain    Pain Onset  In the past 7 days         [facility] OT Assessment - 05/21/17 0001      AROM   Left Forearm Pronation  90 Degrees    Left Forearm Supination  60 Degrees Popping at 60 prior - after fluido 60 no popping    Right Wrist Extension  74 Degrees    Right Wrist Flexion  95 Degrees    Right Wrist Radial Deviation  26 Degrees    Right Wrist Ulnar Deviation  32 Degrees    Left Wrist Extension  50 Degrees strain    Left Wrist Flexion  35 Degrees pull    Left Wrist Radial Deviation  15 Degrees pain    Left Wrist Ulnar Deviation  22 Degrees pull      Measurements taken see  flowsheet - great progress   but popping with pronation after supination to 50-60 degrees - pain 4/10   Even with stabilization of ulnar head          OT Treatments/Exercises (OP) - 05/21/17 0001      LUE Fluidotherapy   Number Minutes Fluidotherapy  10 Minutes    LUE Fluidotherapy Location  Hand;Wrist    Comments  AROM for wrist in all planes - pain free range and no popping       Soft tissue done with graston tool nr 2 on volar and dorsal wrist and forearm prior to AROM of wrist   done wrist flexion , ext, RD, UD over armrest of chair  Supination done to 60 degrees and back to pronation - with popping less and not always ' and pain free after fluido but then pain increase again - but was able to do up to 60 degrees with less or no popping   pt to keep rotation without popping at home  Cont with AROM        OT Education - 05/21/17 1510    Education provided  Yes    Education Details  progress and HEP     Person(s) Educated  Patient    Methods  Explanation;Demonstration;Verbal cues    Comprehension  Verbalized understanding;Returned  demonstration       OT Short Term Goals - 05/15/17 1725      OT SHORT TERM GOAL #1   Title  Pain on PRWHE improve with more than 20 points     Baseline  Pain at eval on PRHWE 42/50     Time  3    Period  Weeks    Status  New    Target Date  06/05/17      OT SHORT TERM GOAL #2   Title  Pt to be ind in HEP for decrease pain , increase ROM , and strenght to be wean out of splint     Baseline  Splint all the time except ADL's  - did not do any HEP yet     Time  3    Period  Weeks    Status  New    Target Date  06/05/17        OT Long Term Goals - 05/15/17 1728      OT LONG TERM GOAL #1   Title  L wrist AROM improve with more than 10-20 degrees without increase symptoms of pain and popping     Baseline  Popping with pronation , wrist RD, UD, flexion and extnetion and sup impaired - see flowsheet     Time  6    Period  Weeks    Status  New    Target Date  06/26/17      OT LONG TERM GOAL #2   Title  L Wrist strength increase to 4/5 m/m to be able to use hand and wris in ADL's more than 75% without increase symptoms     Baseline  no strength yet- started AROM pain free this date- see flowsheet for ROM , using only 37 %     Time  6    Period  Weeks    Status  New    Target Date  06/26/17      OT LONG TERM GOAL #3   Title  L grip strenght to be more than 50% compare to R hand to cut food, pick up more than 3 lbs without  symptoms , cut food, turn doorknob     Baseline  6 wks out - NT yet -  only AROM painfree started this date     Time  6    Period  Weeks    Status  New    Target Date  06/26/17            Plan - 05/21/17 1511    Clinical Impression Statement  Pt present at OT this date wiht increase AROM in L wrist in all planes - but still having popping at head of ulnar with pronation after doing supination to about 50-60 degrees- with pain about 3-4/10 - after fluidotherapy pain was better and could to up to 60 degrees with less popping - pt to work at [employer] in AROM  without pop - stop prior and AROM at HEP should be pain less than 1-2/10    Occupational performance deficits (Please refer to evaluation for details):  ADL's;IADL's;Work;Play;Leisure    Rehab Potential  Good    OT Frequency  2x / week    OT Duration  4 weeks    OT Treatment/Interventions  Self-care/ADL training;Splinting;Fluidtherapy;Therapeutic exercise;Scar mobilization;Paraffin;Manual Therapy;Passive range of motion;Cryotherapy    Plan  assess progress with HEP doing ROM at home without increase pain -and if still popping with pronation     Clinical Decision Making  Several treatment options, min-mod task modification necessary    OT Home Exercise Plan  see pt instruction     Consulted and Agree with Plan of Care  Patient       Patient will benefit from skilled therapeutic intervention in order to improve the following deficits and impairments:  Pain, Impaired flexibility, Increased edema  Visit Diagnosis: Pain in left wrist  Stiffness of left wrist, not elsewhere classified  Stiffness of left hand, not elsewhere classified  Muscle weakness (generalized)  Scar tissue    Problem List There are no active problems to display for this patient.   Rosalyn Gess OTR/L,CLT 05/21/2017, 3:14 PM  [facility] [address] [place], [address] Phone: [phone]   Fax:  [phone]  Name: Sabrina Burns MRN: [id number] Date of Birth: [birth year]

## 2017-05-21 NOTE — Patient Instructions (Signed)
Same HEP but stop prior to feeling wrist is going to pop into pronation

## 2017-05-23 ENCOUNTER — Ambulatory Visit: Payer: PPO | Admitting: Occupational Therapy

## 2017-05-23 DIAGNOSIS — L905 Scar conditions and fibrosis of skin: Secondary | ICD-10-CM

## 2017-05-23 DIAGNOSIS — M25632 Stiffness of left wrist, not elsewhere classified: Secondary | ICD-10-CM

## 2017-05-23 DIAGNOSIS — M25642 Stiffness of left hand, not elsewhere classified: Secondary | ICD-10-CM

## 2017-05-23 DIAGNOSIS — M6281 Muscle weakness (generalized): Secondary | ICD-10-CM

## 2017-05-23 DIAGNOSIS — M25532 Pain in left wrist: Secondary | ICD-10-CM

## 2017-05-23 NOTE — Patient Instructions (Signed)
Circles AROM for wrist  And isometric strengthening 2 x 5 reps for UD, RD  And flexion , extention  But in neutral position  And AROM in other planes

## 2017-05-23 NOTE — Therapy (Signed)
[facility] [address], [place], [address] Phone: [phone]   Fax:  [phone]  Occupational Therapy Treatment  Patient Details  Name: Sabrina Burns MRN: [id number] Date of Birth: 1945-06-03 Referring Provider: Roland Rack   Encounter Date: 05/23/2017  OT End of Session - 05/23/17 1519    Visit Number  3    Number of Visits  10    Date for OT Re-Evaluation  06/19/17    OT Start Time  2025    OT Stop Time  1211    OT Time Calculation (min)  40 min    Activity Tolerance  Patient tolerated treatment well    Behavior During Therapy  [facility] for tasks assessed/performed       Past Medical History:  Diagnosis Date  . Cancer ([place]) 02/2017   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  . Complication of anesthesia    hypotension  with bladder surgery.  . Depression   . H/O: rheumatic fever   . Hypercholesteremia unkn  . Mitral valve prolapse   . Osteoporosis     Past Surgical History:  Procedure Laterality Date  . ABDOMINAL HYSTERECTOMY    . BACK SURGERY     lumbar  . COLONOSCOPY    . FOOT SURGERY Right   . pelvic sling    . SHOULDER ARTHROSCOPY WITH OPEN ROTATOR CUFF REPAIR Right 09/15/2015   Procedure: SHOULDER ARTHROSCOPY WITH OPEN ROTATOR CUFF REPAIR;  Surgeon: Corky Mull, MD;  Location: [facility];  Service: Orthopedics;  Laterality: Right;  . TONSILLECTOMY    . TUBAL LIGATION    . WRIST ARTHROSCOPY WITH FOVEAL TRIANGULAR FIBROCARTILAGE COMPLEX REPAIR Left 03/28/2017   Procedure: WRIST ARTHROSCOPY WITH DEBRIDEMENT AND REPAIR OF TFCC TEAR;  Surgeon: Corky Mull, MD;  Location: [facility];  Service: Orthopedics;  Laterality: Left;    There were no vitals filed for this visit.  Subjective Assessment - 05/23/17 1228    Subjective   Still wants to pop with rotation - some times more than others - the other Range of motions coming along     Patient Stated Goals  Want the pain better in my wrist  so I can use it -  can't do anything with it - only my fingers     Currently in Pain?  Yes    Pain Score  5     Pain Orientation  Left    Pain Descriptors / Indicators  -- popping    Pain Type  Surgical pain    Pain Onset  In the past 7 days    Aggravating Factors   -- with pronation from supination          [facility] OT Assessment - 05/23/17 0001      AROM   Left Forearm Supination  60 Degrees popping less after heat     Left Wrist Extension  58 Degrees    Left Wrist Flexion  42 Degrees    Left Wrist Radial Deviation  20 Degrees    Left Wrist Ulnar Deviation  20 Degrees      MEasure AROM for wrist in all planes   less pain with all except pronation  Popping and pain 3-6/10           OT Treatments/Exercises (OP) - 05/23/17 0001      LUE Fluidotherapy   Number Minutes Fluidotherapy  10 Minutes    LUE Fluidotherapy Location  Hand;Wrist    Comments  AROM for wrist in painfree and without popping range      after heat less pain and less popping - able to go further per pt    Soft tissue done with graston tool nr 2 on volar and dorsal wrist and forearm prior to AROM of wrist   done wrist flexion , ext, RD, UD over armrest of chair  And circular AROM for wrist - pain free and add to HEP  Supination done to 60 degrees and back to pronation - with popping less and not always ' and pain free after fluido but then pain increase again - but was able to do up to 60 degrees with less or no popping  Add and assess isometric in neutral position - for UD, RD, flexion and extention -painfree - and done 2 x 5 reps each   pt to keep rotation without popping at home and use heat to decrease popping  Cont with AROM       OT Education - 05/23/17 1519    Education provided  Yes    Education Details  circles AROM for wrist - and isometric neutral position for wrist - pain free     Person(s) Educated  Patient    Methods  Explanation;Demonstration;Tactile cues;Verbal cues;Handout    Comprehension   Returned demonstration;Verbalized understanding       OT Short Term Goals - 05/15/17 1725      OT SHORT TERM GOAL #1   Title  Pain on PRWHE improve with more than 20 points     Baseline  Pain at eval on PRHWE 42/50     Time  3    Period  Weeks    Status  New    Target Date  06/05/17      OT SHORT TERM GOAL #2   Title  Pt to be ind in HEP for decrease pain , increase ROM , and strenght to be wean out of splint     Baseline  Splint all the time except ADL's  - did not do any HEP yet     Time  3    Period  Weeks    Status  New    Target Date  06/05/17        OT Long Term Goals - 05/15/17 1728      OT LONG TERM GOAL #1   Title  L wrist AROM improve with more than 10-20 degrees without increase symptoms of pain and popping     Baseline  Popping with pronation , wrist RD, UD, flexion and extnetion and sup impaired - see flowsheet     Time  6    Period  Weeks    Status  New    Target Date  06/26/17      OT LONG TERM GOAL #2   Title  L Wrist strength increase to 4/5 m/m to be able to use hand and wris in ADL's more than 75% without increase symptoms     Baseline  no strength yet- started AROM pain free this date- see flowsheet for ROM , using only 37 %     Time  6    Period  Weeks    Status  New    Target Date  06/26/17      OT LONG TERM GOAL #3   Title  L grip strenght to be more than 50% compare to R hand to cut food, pick up more than 3 lbs without symptoms , cut  food, turn doorknob     Baseline  6 wks out - NT yet -  only AROM painfree started this date     Time  6    Period  Weeks    Status  New    Target Date  06/26/17            Plan - 05/23/17 1520    Clinical Impression Statement  Pt cont to show increase AROM in wrist with less pain - except pronation after 60 degrees of supination - has popping and increase pain but then decrease in fluido and after heat-  pain free isometric neutral positoin  add to HEP     Occupational performance deficits (Please  refer to evaluation for details):  ADL's;IADL's;Work;Play;Leisure    Rehab Potential  Good    OT Frequency  2x / week    OT Duration  4 weeks    OT Treatment/Interventions  Self-care/ADL training;Splinting;Fluidtherapy;Therapeutic exercise;Scar mobilization;Paraffin;Manual Therapy;Passive range of motion;Cryotherapy    Plan  assess progress with HEP doing ROM at home without increase pain -and if still popping with pronation and how did with isometric    Clinical Decision Making  Several treatment options, min-mod task modification necessary    OT Home Exercise Plan  see pt instruction     Consulted and Agree with Plan of Care  Patient       Patient will benefit from skilled therapeutic intervention in order to improve the following deficits and impairments:  Pain, Impaired flexibility, Increased edema  Visit Diagnosis: Pain in left wrist  Stiffness of left wrist, not elsewhere classified  Stiffness of left hand, not elsewhere classified  Muscle weakness (generalized)  Scar tissue    Problem List There are no active problems to display for this patient.   Rosalyn Gess OTR/L,CLT 05/23/2017, 3:22 PM  [facility] [address] [place], [address] Phone: [phone]   Fax:  [phone]  Name: Sabrina Burns MRN: [id number] Date of Birth: January 07, 1946

## 2017-05-28 ENCOUNTER — Ambulatory Visit: Payer: PPO | Attending: Surgery | Admitting: Occupational Therapy

## 2017-05-28 DIAGNOSIS — M25532 Pain in left wrist: Secondary | ICD-10-CM | POA: Diagnosis not present

## 2017-05-28 DIAGNOSIS — M25632 Stiffness of left wrist, not elsewhere classified: Secondary | ICD-10-CM | POA: Diagnosis not present

## 2017-05-28 DIAGNOSIS — M25642 Stiffness of left hand, not elsewhere classified: Secondary | ICD-10-CM | POA: Insufficient documentation

## 2017-05-28 DIAGNOSIS — M6281 Muscle weakness (generalized): Secondary | ICD-10-CM | POA: Diagnosis not present

## 2017-05-28 DIAGNOSIS — L905 Scar conditions and fibrosis of skin: Secondary | ICD-10-CM | POA: Diagnosis not present

## 2017-05-28 NOTE — Patient Instructions (Signed)
Add AAROM and PROM to wrist flexion , ext, RD, UD  10 reps pain under 1-2/10  AROM supination - stop when slight pull 10 reps  Cont with heat prior to stretches or PROM  And can do inbetween prayer stretch and flexion wrist stretch   Cont AAROM hands together for wrist UD, RD  Wrist flexion open hand and close or loose fist  10 reps  isometric same as last time added

## 2017-05-28 NOTE — Therapy (Signed)
[facility] [address] [place], [address] Phone: [phone]   Fax:  [phone]  Occupational Therapy Treatment  Patient Details  Name: Sabrina Burns MRN: [id number] Date of Birth: 05-28-1945 Referring Provider: Roland Rack   Encounter Date: 05/28/2017  OT End of Session - 05/28/17 1407    Visit Number  4    Number of Visits  10    Date for OT Re-Evaluation  06/19/17    OT Start Time  1024    OT Stop Time  1105    OT Time Calculation (min)  41 min    Activity Tolerance  Patient tolerated treatment well    Behavior During Therapy  [facility] for tasks assessed/performed       Past Medical History:  Diagnosis Date  . Cancer ([place]) 02/2017   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  . Complication of anesthesia    hypotension  with bladder surgery.  . Depression   . H/O: rheumatic fever   . Hypercholesteremia unkn  . Mitral valve prolapse   . Osteoporosis     Past Surgical History:  Procedure Laterality Date  . ABDOMINAL HYSTERECTOMY    . BACK SURGERY     lumbar  . COLONOSCOPY    . FOOT SURGERY Right   . pelvic sling    . SHOULDER ARTHROSCOPY WITH OPEN ROTATOR CUFF REPAIR Right 09/15/2015   Procedure: SHOULDER ARTHROSCOPY WITH OPEN ROTATOR CUFF REPAIR;  Surgeon: Corky Mull, MD;  Location: [facility];  Service: Orthopedics;  Laterality: Right;  . TONSILLECTOMY    . TUBAL LIGATION    . WRIST ARTHROSCOPY WITH FOVEAL TRIANGULAR FIBROCARTILAGE COMPLEX REPAIR Left 03/28/2017   Procedure: WRIST ARTHROSCOPY WITH DEBRIDEMENT AND REPAIR OF TFCC TEAR;  Surgeon: Corky Mull, MD;  Location: [facility];  Service: Orthopedics;  Laterality: Left;    There were no vitals filed for this visit.  Subjective Assessment - 05/28/17 1045    Subjective   I can get little further rotation - popping still on and off - but pain only 1/10 - I tried to pull the seat belt and it hurts - is that normal     Patient Stated Goals  Want the pain  better in my wrist  so I can use it - can't do anything with it - only my fingers     Currently in Pain?  Yes    Pain Score  3     Pain Location  Wrist    Pain Orientation  Left    Pain Descriptors / Indicators  Aching         [facility] OT Assessment - 05/28/17 0001      AROM   Left Forearm Supination  70 Degrees    Left Wrist Flexion  50 Degrees      Popping still present but 1/10 - and more when cold - less when warm the wrist           OT Treatments/Exercises (OP) - 05/28/17 0001      LUE Fluidotherapy   Number Minutes Fluidotherapy  10 Minutes    LUE Fluidotherapy Location  Hand;Wrist    Comments  AROM for wrist in all planes       after fluido supination this date was 85 but pain was  Increase - pt to stop prior to ROM increase to 2-3/10   after heat less pain and less popping - able to go further per pt  Soft tissue done with graston tool nr 2 on volar and dorsal wrist and forearm prior to AROM of wrist  done wrist flexion , ext, RD, UD over armrest of chair    done and review with pt AAROM and PROM to wrist flexion , ext, RD, UD over edge of table  10 reps pain under 1-2/10  AROM supination - stop when slight pull 10 reps  Cont with heat prior to stretches or PROM at home  And can do inbetween prayer stretch and flexion wrist stretch   Cont AAROM hands together for wrist UD, RD  Wrist flexion open hand and close or loose fist  10 reps  isometric  in neutral position - for UD, RD, flexion and extention   review 10 reps no pain -and cont        OT Education - 05/28/17 1047    Education provided  Yes    Education Details  PROM and AAROM added - cont same AROM and isometric     Person(s) Educated  Patient    Methods  Explanation;Demonstration    Comprehension  Verbalized understanding;Verbal cues required       OT Short Term Goals - 05/15/17 1725      OT SHORT TERM GOAL #1   Title  Pain on PRWHE improve with more than 20 points     Baseline   Pain at eval on PRHWE 42/50     Time  3    Period  Weeks    Status  New    Target Date  06/05/17      OT SHORT TERM GOAL #2   Title  Pt to be ind in HEP for decrease pain , increase ROM , and strenght to be wean out of splint     Baseline  Splint all the time except ADL's  - did not do any HEP yet     Time  3    Period  Weeks    Status  New    Target Date  06/05/17        OT Long Term Goals - 05/15/17 1728      OT LONG TERM GOAL #1   Title  L wrist AROM improve with more than 10-20 degrees without increase symptoms of pain and popping     Baseline  Popping with pronation , wrist RD, UD, flexion and extnetion and sup impaired - see flowsheet     Time  6    Period  Weeks    Status  New    Target Date  06/26/17      OT LONG TERM GOAL #2   Title  L Wrist strength increase to 4/5 m/m to be able to use hand and wris in ADL's more than 75% without increase symptoms     Baseline  no strength yet- started AROM pain free this date- see flowsheet for ROM , using only 37 %     Time  6    Period  Weeks    Status  New    Target Date  06/26/17      OT LONG TERM GOAL #3   Title  L grip strenght to be more than 50% compare to R hand to cut food, pick up more than 3 lbs without symptoms , cut food, turn doorknob     Baseline  6 wks out - NT yet -  only AROM painfree started this date     Time  6  Period  Weeks    Status  New    Target Date  06/26/17            Plan - 05/28/17 1408    Clinical Impression Statement  Pt show increase wrist AROM in all planes - pain decrease since last time in sup and pronation -but popping still at times but pain decrease since last time - able to tolerate isometric and this date  PROM added - but reinforce pt should be less than 2/10     Occupational performance deficits (Please refer to evaluation for details):  ADL's;IADL's;Work;Play;Leisure    Rehab Potential  Good    OT Frequency  2x / week    OT Duration  4 weeks    OT  Treatment/Interventions  Self-care/ADL training;Splinting;Fluidtherapy;Therapeutic exercise;Scar mobilization;Paraffin;Manual Therapy;Passive range of motion;Cryotherapy    Plan  assess progress in ROM - pain and popping ?     Clinical Decision Making  Several treatment options, min-mod task modification necessary    OT Home Exercise Plan  see pt instruction     Consulted and Agree with Plan of Care  Patient       Patient will benefit from skilled therapeutic intervention in order to improve the following deficits and impairments:  Pain, Impaired flexibility, Increased edema  Visit Diagnosis: Pain in left wrist  Stiffness of left wrist, not elsewhere classified  Stiffness of left hand, not elsewhere classified  Muscle weakness (generalized)  Scar tissue    Problem List There are no active problems to display for this patient.   Rosalyn Gess  OTR/L,CLT 05/28/2017, 2:11 PM  [facility] [address], [place], [address] Phone: [phone]   Fax:  [phone]  Name: RONI FRIBERG MRN: [id number] Date of Birth: 12-20-1945

## 2017-05-30 ENCOUNTER — Ambulatory Visit: Payer: PPO | Admitting: Occupational Therapy

## 2017-05-30 DIAGNOSIS — M25532 Pain in left wrist: Secondary | ICD-10-CM | POA: Diagnosis not present

## 2017-05-30 DIAGNOSIS — M6281 Muscle weakness (generalized): Secondary | ICD-10-CM

## 2017-05-30 DIAGNOSIS — M25642 Stiffness of left hand, not elsewhere classified: Secondary | ICD-10-CM

## 2017-05-30 DIAGNOSIS — L905 Scar conditions and fibrosis of skin: Secondary | ICD-10-CM

## 2017-05-30 DIAGNOSIS — M25632 Stiffness of left wrist, not elsewhere classified: Secondary | ICD-10-CM

## 2017-05-30 NOTE — Patient Instructions (Signed)
Cont PROM for wrist  Add 1lbs weight for sup/pro  And isometric for end range RD, UD , flexion and extention  10 reps

## 2017-05-30 NOTE — Therapy (Signed)
[facility] [address] [place], [address] Phone: [phone]   Fax:  [phone]  Occupational Therapy Treatment  Patient Details  Name: MAXIMA SKELTON MRN: [id number] Date of Birth: 01-May-1945 Referring Provider: Roland Rack   Encounter Date: 05/30/2017  OT End of Session - 05/30/17 1830    Visit Number  5    Number of Visits  10    Date for OT Re-Evaluation  06/19/17    OT Start Time  1015    OT Stop Time  1054    OT Time Calculation (min)  39 min    Activity Tolerance  Patient tolerated treatment well    Behavior During Therapy  [facility] for tasks assessed/performed       Past Medical History:  Diagnosis Date  . Cancer ([place]) 02/2017   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  . Complication of anesthesia    hypotension  with bladder surgery.  . Depression   . H/O: rheumatic fever   . Hypercholesteremia unkn  . Mitral valve prolapse   . Osteoporosis     Past Surgical History:  Procedure Laterality Date  . ABDOMINAL HYSTERECTOMY    . BACK SURGERY     lumbar  . COLONOSCOPY    . FOOT SURGERY Right   . pelvic sling    . SHOULDER ARTHROSCOPY WITH OPEN ROTATOR CUFF REPAIR Right 09/15/2015   Procedure: SHOULDER ARTHROSCOPY WITH OPEN ROTATOR CUFF REPAIR;  Surgeon: Corky Mull, MD;  Location: [facility];  Service: Orthopedics;  Laterality: Right;  . TONSILLECTOMY    . TUBAL LIGATION    . WRIST ARTHROSCOPY WITH FOVEAL TRIANGULAR FIBROCARTILAGE COMPLEX REPAIR Left 03/28/2017   Procedure: WRIST ARTHROSCOPY WITH DEBRIDEMENT AND REPAIR OF TFCC TEAR;  Surgeon: Corky Mull, MD;  Location: [facility];  Service: Orthopedics;  Laterality: Left;    There were no vitals filed for this visit.  Subjective Assessment - 05/30/17 1827    Subjective   Doing okay -do not hurt as bad anymore - can move it little more - still popping but pain not more than 1/10     Patient Stated Goals  Want the pain better in my wrist  so I can use  it - can't do anything with it - only my fingers     Currently in Pain?  Yes    Pain Score  2     Pain Location  Wrist    Pain Orientation  Left    Pain Descriptors / Indicators  Aching    Pain Type  Surgical pain    Pain Onset  More than a month ago    Pain Frequency  Occasional         [facility] OT Assessment - 05/30/17 0001      AROM   Left Forearm Supination  75 Degrees    Left Wrist Extension  58 Degrees    Left Wrist Flexion  50 Degrees               OT Treatments/Exercises (OP) - 05/30/17 0001      LUE Fluidotherapy   Number Minutes Fluidotherapy  10 Minutes    LUE Fluidotherapy Location  Hand;Wrist    Comments  AROM for wrist in all planes prior to manual        after fluido supination this date was 85 but pain was decrease - pt to stop prior to pain   after heat less pain and less  popping - able to go further per pt Soft tissue done with graston tool nr 2 on volar and dorsal wrist and forearm prior to AROM of wrist  Feview with pt PROM and  AAROM  For wrist flexion , ext, RD, UD over edge of table  10 reps pain under 1/10  AROM supination - stop when slight pull 10 reps  Cont with heat prior to stretches or PROM at home  And can do inbetween prayer stretch and flexion wrist stretch composite   Cont AAROM hands together for wrist UD, RD  Wrist flexion open hand and close or loose fist  10 reps  isometric  in end range for  UD, RD, flexion and extention  10 reps  And cuff weight around hand for  Sup/pro 10 reps   review 10 reps no pain        OT Education - 05/30/17 1829    Education provided  Yes    Education Details  upgrade to end range isometric and 1 lbs for sup /pro cuff weight     Person(s) Educated  Patient    Methods  Explanation;Demonstration;Tactile cues    Comprehension  Verbalized understanding;Returned demonstration       OT Short Term Goals - 05/15/17 1725      OT SHORT TERM GOAL #1   Title  Pain on PRWHE improve with  more than 20 points     Baseline  Pain at eval on PRHWE 42/50     Time  3    Period  Weeks    Status  New    Target Date  06/05/17      OT SHORT TERM GOAL #2   Title  Pt to be ind in HEP for decrease pain , increase ROM , and strenght to be wean out of splint     Baseline  Splint all the time except ADL's  - did not do any HEP yet     Time  3    Period  Weeks    Status  New    Target Date  06/05/17        OT Long Term Goals - 05/15/17 1728      OT LONG TERM GOAL #1   Title  L wrist AROM improve with more than 10-20 degrees without increase symptoms of pain and popping     Baseline  Popping with pronation , wrist RD, UD, flexion and extnetion and sup impaired - see flowsheet     Time  6    Period  Weeks    Status  New    Target Date  06/26/17      OT LONG TERM GOAL #2   Title  L Wrist strength increase to 4/5 m/m to be able to use hand and wris in ADL's more than 75% without increase symptoms     Baseline  no strength yet- started AROM pain free this date- see flowsheet for ROM , using only 37 %     Time  6    Period  Weeks    Status  New    Target Date  06/26/17      OT LONG TERM GOAL #3   Title  L grip strenght to be more than 50% compare to R hand to cut food, pick up more than 3 lbs without symptoms , cut food, turn doorknob     Baseline  6 wks out - NT yet -  only AROM painfree started  this date     Time  6    Period  Weeks    Status  New    Target Date  06/26/17            Plan - 05/30/17 1830    Clinical Impression Statement  Pt cont to make progress in AROM and PROM of L wrist - pain improving - popping still present but no painfull as was and if stabilize proximal decrease - and if wrist are warm - add isometric end range and 1 lbs for sup /pro      Occupational performance deficits (Please refer to evaluation for details):  ADL's;IADL's;Work;Play;Leisure    Rehab Potential  Good    OT Frequency  2x / week    OT Duration  4 weeks    OT  Treatment/Interventions  Splinting;Fluidtherapy;Therapeutic exercise;Scar mobilization;Paraffin;Manual Therapy;Passive range of motion;Cryotherapy    Plan  assess progress with PROM and strengthening     Clinical Decision Making  Several treatment options, min-mod task modification necessary    OT Home Exercise Plan  see pt instruction     Consulted and Agree with Plan of Care  Patient       Patient will benefit from skilled therapeutic intervention in order to improve the following deficits and impairments:  Pain, Impaired flexibility, Increased edema  Visit Diagnosis: Pain in left wrist  Stiffness of left wrist, not elsewhere classified  Stiffness of left hand, not elsewhere classified  Muscle weakness (generalized)  Scar tissue    Problem List There are no active problems to display for this patient.   Rosalyn Gess OTR/L,CLT 05/30/2017, 6:35 PM  [facility] [address] [place], [address] Phone: [phone]   Fax:  [phone]  Name: IYARI HAGNER MRN: [id number] Date of Birth: 1945/05/16

## 2017-06-04 ENCOUNTER — Ambulatory Visit: Payer: PPO | Admitting: Occupational Therapy

## 2017-06-04 DIAGNOSIS — L905 Scar conditions and fibrosis of skin: Secondary | ICD-10-CM

## 2017-06-04 DIAGNOSIS — M25532 Pain in left wrist: Secondary | ICD-10-CM | POA: Diagnosis not present

## 2017-06-04 DIAGNOSIS — M25632 Stiffness of left wrist, not elsewhere classified: Secondary | ICD-10-CM

## 2017-06-04 DIAGNOSIS — M6281 Muscle weakness (generalized): Secondary | ICD-10-CM

## 2017-06-04 DIAGNOSIS — M25642 Stiffness of left hand, not elsewhere classified: Secondary | ICD-10-CM

## 2017-06-04 NOTE — Therapy (Signed)
[facility] [address] [place], [address] Phone: [phone]   Fax:  [phone]  Occupational Therapy Treatment  Patient Details  Name: Sabrina Burns MRN: [id number] Date of Birth: [birth year] Referring Provider: Roland Rack   Encounter Date: 06/04/2017  OT End of Session - 06/04/17 1733    Visit Number  6    Number of Visits  10    Date for OT Re-Evaluation  06/19/17    OT Start Time  1150    OT Stop Time  1235    OT Time Calculation (min)  45 min    Activity Tolerance  Patient tolerated treatment well    Behavior During Therapy  [facility] for tasks assessed/performed       Past Medical History:  Diagnosis Date  . Cancer ([place]) 02/2017   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  . Complication of anesthesia    hypotension  with bladder surgery.  . Depression   . H/O: rheumatic fever   . Hypercholesteremia unkn  . Mitral valve prolapse   . Osteoporosis     Past Surgical History:  Procedure Laterality Date  . ABDOMINAL HYSTERECTOMY    . BACK SURGERY     lumbar  . COLONOSCOPY    . FOOT SURGERY Right   . pelvic sling    . SHOULDER ARTHROSCOPY WITH OPEN ROTATOR CUFF REPAIR Right 09/15/2015   Procedure: SHOULDER ARTHROSCOPY WITH OPEN ROTATOR CUFF REPAIR;  Surgeon: Corky Mull, MD;  Location: [facility];  Service: Orthopedics;  Laterality: Right;  . TONSILLECTOMY    . TUBAL LIGATION    . WRIST ARTHROSCOPY WITH FOVEAL TRIANGULAR FIBROCARTILAGE COMPLEX REPAIR Left 03/28/2017   Procedure: WRIST ARTHROSCOPY WITH DEBRIDEMENT AND REPAIR OF TFCC TEAR;  Surgeon: Corky Mull, MD;  Location: [facility];  Service: Orthopedics;  Laterality: Left;    There were no vitals filed for this visit.  Subjective Assessment - 06/04/17 1728    Subjective   I figured out what was causing pain on this side in splint - I have this little knot that is tender - do you know what it is - can go little further with my motion no as bad pain      Patient Stated Goals  Want the pain better in my wrist  so I can use it - can't do anything with it - only my fingers     Currently in Pain?  Yes    Pain Score  2     Pain Location  Wrist    Pain Orientation  Left    Pain Descriptors / Indicators  Aching;Tender    Pain Type  Surgical pain    Pain Onset  More than a month ago    Aggravating Factors   small knot on ulnar side -          [facility] OT Assessment - 06/04/17 0001      AROM   Left Forearm Supination  90 Degrees stabilize ulna; and 65 no pop ; 77 SOC     Left Wrist Extension  65 Degrees    Left Wrist Flexion  54 Degrees       Measurement taken - palpated small knot pt show just proximal and medial to ulnar styloid - tender to touch by only 1/10  Pt said this is what bothered her in splint before - some times more tender  Could be that edema is decreasing - and move visible  OT Treatments/Exercises (OP) - 06/04/17 0001      LUE Fluidotherapy   Number Minutes Fluidotherapy  10 Minutes    LUE Fluidotherapy Location  Hand;Wrist    Comments  AROM for wrist in all planes - AROM - decrease pain and increase ROM        after fluido supination this date was 90  but pain was decrease - pt to stop prior to pain   after heat less pain and less popping - able to go further per pt  Review with pt PROM and AAROM  For wrist flexion  with hand in loose fist  for composite flexion stretch  As well as ,  Flexion , ext, RD, UDover edge of table PROM  10 reps pain under 1/10  AROM supination - stop when slight pull 10 reps  Cont with heat prior to stretches or PROMat home And can do inbetween prayer stretch   Upgrade from isometric to 1 lbs weight for wrist in all planes  10 reps  Should have no pain more than 2/10  When did sup - pop with some reps - but not all  Cont with PROM and heat prior  For HEP   10 reps   2 x day   Start wean out of hard splint - to neoprene wrap - 2 hrs on and off         OT Education - 06/04/17 1733    Education provided  Yes    Education Details  focus on PROM and stretch for wrist flexion in loose fist , and 1 lbs weight for wrist     Person(s) Educated  Patient    Methods  Explanation;Demonstration;Tactile cues    Comprehension  Verbalized understanding;Returned demonstration       OT Short Term Goals - 05/15/17 1725      OT SHORT TERM GOAL #1   Title  Pain on PRWHE improve with more than 20 points     Baseline  Pain at eval on PRHWE 42/50     Time  3    Period  Weeks    Status  New    Target Date  06/05/17      OT SHORT TERM GOAL #2   Title  Pt to be ind in HEP for decrease pain , increase ROM , and strenght to be wean out of splint     Baseline  Splint all the time except ADL's  - did not do any HEP yet     Time  3    Period  Weeks    Status  New    Target Date  06/05/17        OT Long Term Goals - 05/15/17 1728      OT LONG TERM GOAL #1   Title  L wrist AROM improve with more than 10-20 degrees without increase symptoms of pain and popping     Baseline  Popping with pronation , wrist RD, UD, flexion and extnetion and sup impaired - see flowsheet     Time  6    Period  Weeks    Status  New    Target Date  06/26/17      OT LONG TERM GOAL #2   Title  L Wrist strength increase to 4/5 m/m to be able to use hand and wris in ADL's more than 75% without increase symptoms     Baseline  no strength yet- started AROM pain free this date-  see flowsheet for ROM , using only 37 %     Time  6    Period  Weeks    Status  New    Target Date  06/26/17      OT LONG TERM GOAL #3   Title  L grip strenght to be more than 50% compare to R hand to cut food, pick up more than 3 lbs without symptoms , cut food, turn doorknob     Baseline  6 wks out - NT yet -  only AROM painfree started this date     Time  6    Period  Weeks    Status  New    Target Date  06/26/17            Plan - 06/04/17 1734    Clinical Impression  Statement  Pt cont to make progress in AROM in wrist in all planes- coming in wrist sup 75 but popping - but with some light stabilization at ulna no popping and after fluido - increase to 90 degrees - pt do show small bump  - under skin that is tender ( stitch, cuffling? )     Occupational performance deficits (Please refer to evaluation for details):  ADL's;IADL's;Work;Play;Leisure    Rehab Potential  Good    OT Frequency  2x / week    OT Duration  4 weeks    OT Treatment/Interventions  Splinting;Fluidtherapy;Therapeutic exercise;Scar mobilization;Paraffin;Manual Therapy;Passive range of motion;Cryotherapy    Plan  assess progress with PROM  for wrist flexion and  how tolerating 1 lbs strengthening     Clinical Decision Making  Several treatment options, min-mod task modification necessary    OT Home Exercise Plan  see pt instruction     Consulted and Agree with Plan of Care  Patient       Patient will benefit from skilled therapeutic intervention in order to improve the following deficits and impairments:  Pain, Impaired flexibility, Increased edema  Visit Diagnosis: Pain in left wrist  Stiffness of left wrist, not elsewhere classified  Stiffness of left hand, not elsewhere classified  Muscle weakness (generalized)  Scar tissue    Problem List There are no active problems to display for this patient.   Rosalyn Gess OTR/L,CLT  06/04/2017, 5:40 PM  [facility] [address] [place], [address] Phone: [phone]   Fax:  [phone]  Name: Sabrina Burns MRN: [id number] Date of Birth: [birth year]

## 2017-06-04 NOTE — Patient Instructions (Signed)
Upgrade from isometric to 1 lbs weight for wrist in all planes  10 reps  Should have no pain more than 2/10  Cont with PROM and heat prior

## 2017-06-06 ENCOUNTER — Ambulatory Visit: Payer: PPO | Admitting: Occupational Therapy

## 2017-06-06 DIAGNOSIS — M25532 Pain in left wrist: Secondary | ICD-10-CM | POA: Diagnosis not present

## 2017-06-06 DIAGNOSIS — M6281 Muscle weakness (generalized): Secondary | ICD-10-CM

## 2017-06-06 DIAGNOSIS — M25632 Stiffness of left wrist, not elsewhere classified: Secondary | ICD-10-CM

## 2017-06-06 DIAGNOSIS — L905 Scar conditions and fibrosis of skin: Secondary | ICD-10-CM

## 2017-06-06 DIAGNOSIS — M25642 Stiffness of left hand, not elsewhere classified: Secondary | ICD-10-CM

## 2017-06-06 NOTE — Therapy (Signed)
[place] [facility] [address], [place], [address] Phone: [phone]   Fax:  [phone]  Occupational Therapy Treatment  Patient Details  Name: Sabrina Burns MRN: [id number] Date of Birth: 05/05/1945 Referring Provider: Roland Rack   Encounter Date: 06/06/2017  OT End of Session - 06/06/17 1118    Visit Number  7    Number of Visits  10    Date for OT Re-Evaluation  06/19/17    OT Start Time  1101    OT Stop Time  1145    OT Time Calculation (min)  44 min    Activity Tolerance  Patient tolerated treatment well    Behavior During Therapy  [facility] for tasks assessed/performed       Past Medical History:  Diagnosis Date  . Cancer ([place]) 02/2017   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  . Complication of anesthesia    hypotension  with bladder surgery.  . Depression   . H/O: rheumatic fever   . Hypercholesteremia unkn  . Mitral valve prolapse   . Osteoporosis     Past Surgical History:  Procedure Laterality Date  . ABDOMINAL HYSTERECTOMY    . BACK SURGERY     lumbar  . COLONOSCOPY    . FOOT SURGERY Right   . pelvic sling    . SHOULDER ARTHROSCOPY WITH OPEN ROTATOR CUFF REPAIR Right 09/15/2015   Procedure: SHOULDER ARTHROSCOPY WITH OPEN ROTATOR CUFF REPAIR;  Surgeon: Corky Mull, MD;  Location: [facility] ORS;  Service: Orthopedics;  Laterality: Right;  . TONSILLECTOMY    . TUBAL LIGATION    . WRIST ARTHROSCOPY WITH FOVEAL TRIANGULAR FIBROCARTILAGE COMPLEX REPAIR Left 03/28/2017   Procedure: WRIST ARTHROSCOPY WITH DEBRIDEMENT AND REPAIR OF TFCC TEAR;  Surgeon: Corky Mull, MD;  Location: [facility] ORS;  Service: Orthopedics;  Laterality: Left;    There were no vitals filed for this visit.  Subjective Assessment - 06/06/17 1116    Subjective   I did okay with the exercises - I rolled up the 1 lbs weigth and did my exercises - and did okay - no pain - the popping is there but I am not paying attention to it and it do not  hurt- my wrist feels weak not my grip     Patient Stated Goals  Want the pain better in my wrist  so I can use it - can't do anything with it - only my fingers     Currently in Pain?  No/denies         [facility] OT Assessment - 06/06/17 0001      AROM   Left Forearm Supination  -- 85 SOC    Left Wrist Extension  65 Degrees    Left Wrist Flexion  60 Degrees open hand , 54 close fist       Strength   Right Hand Grip (lbs)  20    Right Hand Lateral Pinch  12 lbs    Right Hand 3 Point Pinch  10 lbs    Left Hand Grip (lbs)  29    Left Hand Lateral Pinch  10 lbs    Left Hand 3 Point Pinch  8 lbs               OT Treatments/Exercises (OP) - 06/06/17 0001      LUE Fluidotherapy   Number Minutes Fluidotherapy  10 Minutes    LUE Fluidotherapy Location  Hand;Wrist    Comments  AROM for wrist  - focus on composite flexion and  sup /pronation        decrease popping and increase AROM in sup , extention and flexion   pt to focus on composite flexion of wrist   Done Graston tool nr 2 for sweeping over volar and dorsal forearm prior to ROM and stretches to increase ROM   after heat less pain and less popping - able to go further in ROM   PROM and prolonged flexion stretch for wrist composite flexion and extention   as well RD, UD by OT   prior to 1 lbs weight  2 sets of 1 lbs weight for wrist in all planes - no pain  Add to HEP   Cont  wean out of hard splint - to neoprene wrap - 3 -4 hrs and 2 hrs on for hard   also not sleeping with splint          OT Education - 06/06/17 1147    Education provided  Yes       OT Short Term Goals - 05/15/17 1725      OT SHORT TERM GOAL #1   Title  Pain on PRWHE improve with more than 20 points     Baseline  Pain at eval on PRHWE 42/50     Time  3    Period  Weeks    Status  New    Target Date  06/05/17      OT SHORT TERM GOAL #2   Title  Pt to be ind in HEP for decrease pain , increase ROM , and strenght to be wean  out of splint     Baseline  Splint all the time except ADL's  - did not do any HEP yet     Time  3    Period  Weeks    Status  New    Target Date  06/05/17        OT Long Term Goals - 05/15/17 1728      OT LONG TERM GOAL #1   Title  L wrist AROM improve with more than 10-20 degrees without increase symptoms of pain and popping     Baseline  Popping with pronation , wrist RD, UD, flexion and extnetion and sup impaired - see flowsheet     Time  6    Period  Weeks    Status  New    Target Date  06/26/17      OT LONG TERM GOAL #2   Title  L Wrist strength increase to 4/5 m/m to be able to use hand and wris in ADL's more than 75% without increase symptoms     Baseline  no strength yet- started AROM pain free this date- see flowsheet for ROM , using only 37 %     Time  6    Period  Weeks    Status  New    Target Date  06/26/17      OT LONG TERM GOAL #3   Title  L grip strenght to be more than 50% compare to R hand to cut food, pick up more than 3 lbs without symptoms , cut food, turn doorknob     Baseline  6 wks out - NT yet -  only AROM painfree started this date     Time  6    Period  Weeks    Status  New    Target Date  06/26/17  Plan - 06/06/17 1119    Clinical Impression Statement  Pt cont to make progress in AROM in wrist in all planes - popping at ulnar head not has pronounce and consistant - decrease pain  - and able to increase strength and weaning out of splint     Occupational performance deficits (Please refer to evaluation for details):  ADL's;IADL's;Work;Play;Leisure    Rehab Potential  Good    OT Frequency  2x / week    OT Duration  4 weeks    OT Treatment/Interventions  Splinting;Fluidtherapy;Therapeutic exercise;Scar mobilization;Paraffin;Manual Therapy;Passive range of motion;Cryotherapy    Plan  upgrade to 2 lbs weight and weaning further out of splints     Clinical Decision Making  Several treatment options, min-mod task modification  necessary    OT Home Exercise Plan  see pt instruction     Consulted and Agree with Plan of Care  Patient       Patient will benefit from skilled therapeutic intervention in order to improve the following deficits and impairments:  Pain, Impaired flexibility, Increased edema  Visit Diagnosis: Pain in left wrist  Stiffness of left wrist, not elsewhere classified  Stiffness of left hand, not elsewhere classified  Muscle weakness (generalized)  Scar tissue    Problem List There are no active problems to display for this patient.   Rosalyn Gess OTR/L,CLT  06/06/2017, 11:49 AM  [place] [facility] [address] [place], [address] Phone: [phone]   Fax:  [phone]  Name: LORRIN BODNER MRN: [id number] Date of Birth: 03-13-1946

## 2017-06-06 NOTE — Patient Instructions (Signed)
Increase to 2 sets of 1 lbs  10 reps  and then wean to 3 hrs on neoprene splint and hard 2 hrs  And then if not increase pain 4 hrs on soft and 2 hrs on hard  Cont with stretches

## 2017-06-11 ENCOUNTER — Ambulatory Visit: Payer: PPO | Admitting: Occupational Therapy

## 2017-06-11 DIAGNOSIS — M25642 Stiffness of left hand, not elsewhere classified: Secondary | ICD-10-CM

## 2017-06-11 DIAGNOSIS — M25532 Pain in left wrist: Secondary | ICD-10-CM | POA: Diagnosis not present

## 2017-06-11 DIAGNOSIS — M6281 Muscle weakness (generalized): Secondary | ICD-10-CM

## 2017-06-11 DIAGNOSIS — L905 Scar conditions and fibrosis of skin: Secondary | ICD-10-CM

## 2017-06-11 DIAGNOSIS — M25632 Stiffness of left wrist, not elsewhere classified: Secondary | ICD-10-CM

## 2017-06-11 NOTE — Patient Instructions (Signed)
Increase to 2 lbs weight - 2 x day  10 reps   can increase to 2 sets in 2 -3 days  And then 2-3 days again 3 sets   should be pain free  And work in range with no popping  Cont with composite flexion stretch for wrist   extention  And supination AROM prior to weight  Can wean gradually out of neoprene to no splint - and increase hours out every 2-3 days

## 2017-06-11 NOTE — Therapy (Signed)
[place] [facility] [address], [place], [address] Phone: [phone]   Fax:  [phone]  Occupational Therapy Treatment  Patient Details  Name: Sabrina Burns MRN: [id number] Date of Birth: [birth year] Referring Provider: Roland Rack   Encounter Date: 06/11/2017  OT End of Session - 06/11/17 1425    Visit Number  8    Number of Visits  10    Date for OT Re-Evaluation  06/19/17    OT Start Time  1345    OT Stop Time  1421    OT Time Calculation (min)  36 min    Activity Tolerance  Patient tolerated treatment well    Behavior During Therapy  [facility] for tasks assessed/performed       Past Medical History:  Diagnosis Date  . Cancer ([place]) 02/2017   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  . Complication of anesthesia    hypotension  with bladder surgery.  . Depression   . H/O: rheumatic fever   . Hypercholesteremia unkn  . Mitral valve prolapse   . Osteoporosis     Past Surgical History:  Procedure Laterality Date  . ABDOMINAL HYSTERECTOMY    . BACK SURGERY     lumbar  . COLONOSCOPY    . FOOT SURGERY Right   . pelvic sling    . SHOULDER ARTHROSCOPY WITH OPEN ROTATOR CUFF REPAIR Right 09/15/2015   Procedure: SHOULDER ARTHROSCOPY WITH OPEN ROTATOR CUFF REPAIR;  Surgeon: Corky Mull, MD;  Location: [facility] ORS;  Service: Orthopedics;  Laterality: Right;  . TONSILLECTOMY    . TUBAL LIGATION    . WRIST ARTHROSCOPY WITH FOVEAL TRIANGULAR FIBROCARTILAGE COMPLEX REPAIR Left 03/28/2017   Procedure: WRIST ARTHROSCOPY WITH DEBRIDEMENT AND REPAIR OF TFCC TEAR;  Surgeon: Corky Mull, MD;  Location: [facility] ORS;  Service: Orthopedics;  Laterality: Left;    There were no vitals filed for this visit.  Subjective Assessment - 06/11/17 1401    Subjective   Doing better - I can pull now the seatbelt - use my hand more - it hurts turning palm over - popping is less - and no pain with 1 lbs weight     Patient Stated Goals  Want the pain  better in my wrist  so I can use it - can't do anything with it - only my fingers     Currently in Pain?  No/denies         [facility] OT Assessment - 06/11/17 0001      AROM   Left Forearm Supination  85 Degrees SOC     Left Wrist Extension  65 Degrees    Left Wrist Flexion  65 Degrees composite flexion 60               OT Treatments/Exercises (OP) - 06/11/17 0001      LUE Fluidotherapy   Number Minutes Fluidotherapy  10 Minutes    LUE Fluidotherapy Location  Hand;Wrist    Comments  AROM for wrist in all planes to increase ROM       decrease popping and increase AROM in sup , extention and flexion   pt to focus on composite flexion of wrist   Done Graston tool nr 2 for sweeping over volar and dorsal forearm prior to ROM and stretches to increase ROM   after heat less pain and no popping to 85-90 degrees  - able to go further in ROM   PROM and prolonged flexion stretch  for wrist composite flexion and extention   as well RD, UD by OT  Increase to 2 lbs weight - 2 x day  10 reps   can increase to 2 sets in 2 -3 days  And then 2-3 days again 3 sets   should be pain free  And work in range with no popping  Cont with composite flexion stretch for wrist   extention  And supination AROM prior to weight  Can wean gradually out of neoprene to no splint - and increase hours out every 2-3 days         OT Education - 06/11/17 1424    Education provided  Yes    Education Details  increase to 2 lbs weight - and stretches to cont - and how to wean out of splints     Person(s) Educated  Patient    Methods  Explanation;Demonstration;Tactile cues    Comprehension  Verbalized understanding;Returned demonstration       OT Short Term Goals - 05/15/17 1725      OT SHORT TERM GOAL #1   Title  Pain on PRWHE improve with more than 20 points     Baseline  Pain at eval on PRHWE 42/50     Time  3    Period  Weeks    Status  New    Target Date  06/05/17      OT SHORT  TERM GOAL #2   Title  Pt to be ind in HEP for decrease pain , increase ROM , and strenght to be wean out of splint     Baseline  Splint all the time except ADL's  - did not do any HEP yet     Time  3    Period  Weeks    Status  New    Target Date  06/05/17        OT Long Term Goals - 05/15/17 1728      OT LONG TERM GOAL #1   Title  L wrist AROM improve with more than 10-20 degrees without increase symptoms of pain and popping     Baseline  Popping with pronation , wrist RD, UD, flexion and extnetion and sup impaired - see flowsheet     Time  6    Period  Weeks    Status  New    Target Date  06/26/17      OT LONG TERM GOAL #2   Title  L Wrist strength increase to 4/5 m/m to be able to use hand and wris in ADL's more than 75% without increase symptoms     Baseline  no strength yet- started AROM pain free this date- see flowsheet for ROM , using only 37 %     Time  6    Period  Weeks    Status  New    Target Date  06/26/17      OT LONG TERM GOAL #3   Title  L grip strenght to be more than 50% compare to R hand to cut food, pick up more than 3 lbs without symptoms , cut food, turn doorknob     Baseline  6 wks out - NT yet -  only AROM painfree started this date     Time  6    Period  Weeks    Status  New    Target Date  06/26/17            Plan - 06/11/17  1425    Clinical Impression Statement  Pt cont to show increase ROM at wrist in flexion , extention and  supination with decrease in popping -had no popping after fluido and increase strength - upgrade to 2 lbs weight and pt can wean out of neoprene gradually     Occupational performance deficits (Please refer to evaluation for details):  ADL's;IADL's;Work;Play;Leisure    Rehab Potential  Good    OT Frequency  1x / week    OT Duration  2 weeks    OT Treatment/Interventions  Splinting;Fluidtherapy;Therapeutic exercise;Scar mobilization;Paraffin;Manual Therapy;Passive range of motion;Cryotherapy    Plan  assess progress  with  2 lbs weight and weaning further out of splints     Clinical Decision Making  Several treatment options, min-mod task modification necessary    OT Home Exercise Plan  see pt instruction     Consulted and Agree with Plan of Care  Patient       Patient will benefit from skilled therapeutic intervention in order to improve the following deficits and impairments:  Pain, Impaired flexibility, Increased edema  Visit Diagnosis: Pain in left wrist  Stiffness of left wrist, not elsewhere classified  Stiffness of left hand, not elsewhere classified  Muscle weakness (generalized)  Scar tissue    Problem List There are no active problems to display for this patient.   Rosalyn Gess OTR/L, CLT 06/11/2017, 2:29 PM  [place] [facility] [address], [place], [address] Phone: [phone]   Fax:  [phone]  Name: Sabrina Burns MRN: [id number] Date of Birth: Jun 05, 1945

## 2017-06-13 ENCOUNTER — Ambulatory Visit: Payer: PPO | Admitting: Occupational Therapy

## 2017-06-14 ENCOUNTER — Ambulatory Visit: Payer: PPO | Admitting: Occupational Therapy

## 2017-06-14 DIAGNOSIS — M25632 Stiffness of left wrist, not elsewhere classified: Secondary | ICD-10-CM

## 2017-06-14 DIAGNOSIS — L905 Scar conditions and fibrosis of skin: Secondary | ICD-10-CM

## 2017-06-14 DIAGNOSIS — M25532 Pain in left wrist: Secondary | ICD-10-CM | POA: Diagnosis not present

## 2017-06-14 DIAGNOSIS — M6281 Muscle weakness (generalized): Secondary | ICD-10-CM

## 2017-06-14 DIAGNOSIS — M25642 Stiffness of left hand, not elsewhere classified: Secondary | ICD-10-CM

## 2017-06-14 NOTE — Patient Instructions (Signed)
Pt to cont with 2 lbs - increase to 3 sets  And just gradually increase functional strengthening -  Keep pain 1-2/10 at the most  And cont to work on flexibility for wrist flexion and extention as well as sup/pro  And then gradually decrease session in future

## 2017-06-14 NOTE — Therapy (Signed)
[place] [facility] [address], [place], [address] Phone: [phone]   Fax:  [phone]  Occupational Therapy Treatment  Patient Details  Name: Sabrina Burns MRN: [id number] Date of Birth: Jul 08, 1945 Referring Provider: Roland Burns   Encounter Date: 06/14/2017  OT End of Session - 06/14/17 1357    Visit Number  9    Number of Visits  10    Date for OT Re-Evaluation  06/19/17    OT Start Time  1315    OT Stop Time  1345    OT Time Calculation (min)  30 min    Activity Tolerance  Patient tolerated treatment well    Behavior During Therapy  [facility] for tasks assessed/performed       Past Medical History:  Diagnosis Date  . Cancer ([place]) 02/2017   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  . Complication of anesthesia    hypotension  with bladder surgery.  . Depression   . H/O: rheumatic fever   . Hypercholesteremia unkn  . Mitral valve prolapse   . Osteoporosis     Past Surgical History:  Procedure Laterality Date  . ABDOMINAL HYSTERECTOMY    . BACK SURGERY     lumbar  . COLONOSCOPY    . FOOT SURGERY Right   . pelvic sling    . SHOULDER ARTHROSCOPY WITH OPEN ROTATOR CUFF REPAIR Right 09/15/2015   Procedure: SHOULDER ARTHROSCOPY WITH OPEN ROTATOR CUFF REPAIR;  Surgeon: Corky Mull, MD;  Location: [facility];  Service: Orthopedics;  Laterality: Right;  . TONSILLECTOMY    . TUBAL LIGATION    . WRIST ARTHROSCOPY WITH FOVEAL TRIANGULAR FIBROCARTILAGE COMPLEX REPAIR Left 03/28/2017   Procedure: WRIST ARTHROSCOPY WITH DEBRIDEMENT AND REPAIR OF TFCC TEAR;  Surgeon: Corky Mull, MD;  Location: [facility] OR[facility];  Service: Orthopedics;  Laterality: Left;    There were no vitals filed for this visit.  Subjective Assessment - 06/14/17 1355    Subjective   I am using my hand in most everything - where the soft splint at times - with something heavy - popping getting less - and after heat not happening - going to [place] next week and  then  seeing Dr Sabrina Burns     Patient Stated Goals  Want the pain better in my wrist  so I can use it - can't do anything with it - only my fingers     Currently in Pain?  No/denies         [facility] OT Assessment - 06/14/17 0001      AROM   Left Forearm Supination  90 Degrees    Left Wrist Extension  68 Degrees    Left Wrist Flexion  74 Degrees 68 close hand     Left Wrist Radial Deviation  20 Degrees    Left Wrist Ulnar Deviation  25 Degrees      Strength   Right Hand Grip (lbs)  30    Right Hand Lateral Pinch  12 lbs    Right Hand 3 Point Pinch  10 lbs    Left Hand Grip (lbs)  35    Left Hand Lateral Pinch  12 lbs    Left Hand 3 Point Pinch  9 lbs      Assess pt AROM for L wrist and grip /prehension   see flowsheet  Great progress in pain , AROM wrist , grip and prehension  And functional use   pt going out of  town next week  Review HEP - and will see Dr Sabrina Burns when back :    Pt to cont with 2 lbs - increase to 3 sets  And just gradually increase functional strengthening -  Keep pain 1-2/10 at the most  And cont to work on flexibility for wrist flexion and extention as well as sup/pro  And then gradually decrease session in future                OT Education - 06/14/17 1357    Education provided  Yes    Education Details  2 lbs weight increase gradually to 3 sets - and cont with stretches - and wean out of soft splint     Person(s) Educated  Patient    Methods  Explanation;Demonstration;Tactile cues    Comprehension  Verbalized understanding;Returned demonstration       OT Short Term Goals - 06/14/17 1400      OT SHORT TERM GOAL #1   Title  Pain on PRWHE improve with more than 20 points     Baseline  Pain at eval on PRHWE 42/50  and now 7/50    Status  Achieved      OT SHORT TERM GOAL #2   Title  Pt to be ind in HEP for decrease pain , increase ROM , and strenght to be wean out of splint     Status  Achieved        OT Long Term Goals - 06/14/17  1400      OT LONG TERM GOAL #1   Title  L wrist AROM improve with more than 10-20 degrees without increase symptoms of pain and popping     Status  Achieved      OT LONG TERM GOAL #2   Title  L Wrist strength increase to 4/5 m/m to be able to use hand and wris in ADL's more than 75% without increase symptoms     Status  Achieved      OT LONG TERM GOAL #3   Title  L grip strenght to be more than 50% compare to R hand to cut food, pick up more than 3 lbs without symptoms , cut food, turn doorknob     Baseline  R hand 30 , L 35 lbs     Status  Achieved            Plan - 06/14/17 1358    Clinical Impression Statement  Pt made great progress in pain , wrist AROM in all planes and strength - increase functional use and wean out of splints - pt to cont working on flexibility for wrist , increase 2 lbs weigth - and functional strength - to see surgeon in 2 wks - and if need to follow up with me in 3-4 wks - otherwise if I do not hear from her - she is good     Occupational performance deficits (Please refer to evaluation for details):  ADL's;IADL's;Work;Play;Leisure    Rehab Potential  Good    OT Frequency  Biweekly    OT Duration  2 weeks    OT Treatment/Interventions  Splinting;Fluidtherapy;Therapeutic exercise;Scar mobilization;Paraffin;Manual Therapy;Passive range of motion;Cryotherapy    Plan  contact me in 3-4 wks if need to be seen     Clinical Decision Making  Limited treatment options, no task modification necessary    OT Home Exercise Plan  see pt instruction     Consulted and Agree with Plan of Care  Patient       Patient will benefit from skilled therapeutic intervention in order to improve the following deficits and impairments:  Pain, Impaired flexibility, Increased edema  Visit Diagnosis: Pain in left wrist  Stiffness of left wrist, not elsewhere classified  Stiffness of left hand, not elsewhere classified  Muscle weakness (generalized)  Scar  tissue    Problem List There are no active problems to display for this patient.   Rosalyn Gess OTR/L,CLT 06/14/2017, 2:02 PM  [place] [facility] [address], [place], [address] Phone: [phone]   Fax:  [phone]  Name: Sabrina Burns MRN: [id number] Date of Birth: [birth year]

## 2017-06-18 ENCOUNTER — Encounter: Payer: PPO | Admitting: Occupational Therapy

## 2017-06-24 DIAGNOSIS — S63592D Other specified sprain of left wrist, subsequent encounter: Secondary | ICD-10-CM | POA: Diagnosis not present

## 2017-06-26 ENCOUNTER — Other Ambulatory Visit: Payer: Self-pay

## 2017-06-26 ENCOUNTER — Encounter: Payer: Self-pay | Admitting: *Deleted

## 2017-07-03 ENCOUNTER — Ambulatory Visit
Admission: RE | Admit: 2017-07-03 | Discharge: 2017-07-03 | Disposition: A | Discharge status: Home / Self Care | Payer: PPO | Source: Ambulatory Visit | Attending: Surgery | Admitting: Surgery

## 2017-07-03 ENCOUNTER — Ambulatory Visit: Payer: PPO | Admitting: Anesthesiology

## 2017-07-03 ENCOUNTER — Encounter
Admission: RE | Disposition: A | Discharge status: Home / Self Care | Payer: Self-pay | Source: Ambulatory Visit | Attending: Surgery

## 2017-07-03 DIAGNOSIS — F329 Major depressive disorder, single episode, unspecified: Secondary | ICD-10-CM | POA: Diagnosis not present

## 2017-07-03 DIAGNOSIS — Z189 Retained foreign body fragments, unspecified material: Secondary | ICD-10-CM | POA: Diagnosis not present

## 2017-07-03 DIAGNOSIS — T8189XA Other complications of procedures, not elsewhere classified, initial encounter: Secondary | ICD-10-CM | POA: Diagnosis not present

## 2017-07-03 DIAGNOSIS — Y838 Other surgical procedures as the cause of abnormal reaction of the patient, or of later complication, without mention of misadventure at the time of the procedure: Secondary | ICD-10-CM | POA: Insufficient documentation

## 2017-07-03 DIAGNOSIS — Z4802 Encounter for removal of sutures: Secondary | ICD-10-CM | POA: Diagnosis not present

## 2017-07-03 DIAGNOSIS — S63592D Other specified sprain of left wrist, subsequent encounter: Secondary | ICD-10-CM | POA: Insufficient documentation

## 2017-07-03 DIAGNOSIS — E785 Hyperlipidemia, unspecified: Secondary | ICD-10-CM | POA: Diagnosis not present

## 2017-07-03 DIAGNOSIS — Z79899 Other long term (current) drug therapy: Secondary | ICD-10-CM | POA: Insufficient documentation

## 2017-07-03 DIAGNOSIS — M60232 Foreign body granuloma of soft tissue, not elsewhere classified, left forearm: Secondary | ICD-10-CM | POA: Diagnosis not present

## 2017-07-03 DIAGNOSIS — X58XXXD Exposure to other specified factors, subsequent encounter: Secondary | ICD-10-CM | POA: Diagnosis not present

## 2017-07-03 HISTORY — PX: SUTURE REMOVAL: SHX6354

## 2017-07-03 SURGERY — REMOVAL, SUTURE
Anesthesia: General | Laterality: Left | Wound class: "Clean "

## 2017-07-03 MED ORDER — LIDOCAINE HCL (CARDIAC) 20 MG/ML IV SOLN
INTRAVENOUS | Status: DC | PRN
  Administered 2017-07-03: 40 mg via INTRATRACHEAL

## 2017-07-03 MED ORDER — LACTATED RINGERS IV SOLN
10.0000 mL/h | INTRAVENOUS | Status: DC
  Administered 2017-07-03: 10 mL/h via INTRAVENOUS

## 2017-07-03 MED ORDER — PROPOFOL 10 MG/ML IV BOLUS
INTRAVENOUS | Status: DC | PRN
  Administered 2017-07-03: 80 mg via INTRAVENOUS

## 2017-07-03 MED ORDER — OXYCODONE HCL 5 MG PO TABS: 5.0000 mg | ORAL_TABLET | Freq: Once | ORAL | Status: DC | PRN

## 2017-07-03 MED ORDER — OXYCODONE HCL 5 MG/5ML PO SOLN: 5.0000 mg | Freq: Once | ORAL | Status: DC | PRN

## 2017-07-03 MED ORDER — ONDANSETRON HCL 4 MG/2ML IJ SOLN
INTRAMUSCULAR | Status: DC | PRN
  Administered 2017-07-03: 4 mg via INTRAVENOUS

## 2017-07-03 MED ORDER — GLYCOPYRROLATE 0.2 MG/ML IJ SOLN
INTRAMUSCULAR | Status: DC | PRN
  Administered 2017-07-03: 0.1 mg via INTRAVENOUS

## 2017-07-03 MED ORDER — ACETAMINOPHEN 325 MG PO TABS
650.0000 mg | ORAL_TABLET | Freq: Four times a day (QID) | ORAL | Status: DC | PRN
  Administered 2017-07-03: 650 mg via ORAL

## 2017-07-03 MED ORDER — SODIUM CHLORIDE 0.9 % IV SOLN
2000.0000 mg | Freq: Once | INTRAVENOUS | Status: AC
  Administered 2017-07-03: 2000 mg via INTRAVENOUS

## 2017-07-03 MED ORDER — MIDAZOLAM HCL 5 MG/5ML IJ SOLN
INTRAMUSCULAR | Status: DC | PRN
  Administered 2017-07-03: 1 mg via INTRAVENOUS

## 2017-07-03 MED ORDER — BUPIVACAINE-EPINEPHRINE 0.5% -1:200000 IJ SOLN
INTRAMUSCULAR | Status: DC | PRN
  Administered 2017-07-03: 5 mL

## 2017-07-03 MED ORDER — LACTATED RINGERS IV SOLN: 10.0000 mL/h | INTRAVENOUS | Status: DC

## 2017-07-03 SURGICAL SUPPLY — 14 items
BANDAGE ELASTIC 3 LF NS (GAUZE/BANDAGES/DRESSINGS) ×1 IMPLANT
CHLORAPREP W/TINT 26ML (MISCELLANEOUS) ×3 IMPLANT
COVER LIGHT HANDLE UNIVERSAL (MISCELLANEOUS) ×3 IMPLANT
GAUZE PETRO XEROFOAM 1X8 (MISCELLANEOUS) ×2 IMPLANT
GAUZE SPONGE 4X4 12PLY STRL (GAUZE/BANDAGES/DRESSINGS) ×1 IMPLANT
GLOVE BIO SURGEON STRL SZ8 (GLOVE) ×4 IMPLANT
GLOVE INDICATOR 8.0 STRL GRN (GLOVE) ×2 IMPLANT
KIT TURNOVER KIT A (KITS) ×2 IMPLANT
NDL 18GX1X1/2 (RX/OR ONLY) (NEEDLE) ×1 IMPLANT
NEEDLE 18GX1X1/2 (RX/OR ONLY) (NEEDLE) ×2 IMPLANT
NS IRRIG 500ML POUR BTL (IV SOLUTION) ×2 IMPLANT
STRAP BODY AND KNEE 60X3 (MISCELLANEOUS) ×2 IMPLANT
SUT PROLENE 4 0 PS 2 18 (SUTURE) ×1 IMPLANT
SYRINGE 10CC LL (SYRINGE) ×1 IMPLANT

## 2017-07-03 NOTE — Anesthesia Preprocedure Evaluation (Signed)
Anesthesia Evaluation  Patient identified by MRN, date of birth, ID band  Reviewed: NPO status   History of Anesthesia Complications Negative for: history of anesthetic complications  Airway Mallampati: II  TM Distance: >3 FB Neck ROM: full    Dental no notable dental hx.    Pulmonary neg pulmonary ROS,    Pulmonary exam normal        Cardiovascular Exercise Tolerance: Good negative cardio ROS Normal cardiovascular exam+ Valvular Problems/Murmurs MVP      Neuro/Psych Anxiety Depression negative neurological ROS     GI/Hepatic negative GI ROS, Neg liver ROS,   Endo/Other  negative endocrine ROS  Renal/GU negative Renal ROS  negative genitourinary   Musculoskeletal   Abdominal   Peds  Hematology Skin cancer    Anesthesia Other Findings TIVA  Reproductive/Obstetrics                             Anesthesia Physical Anesthesia Plan  ASA: II  Anesthesia Plan: General   Post-op Pain Management:    Induction:   PONV Risk Score and Plan:   Airway Management Planned:   Additional Equipment:   Intra-op Plan:   Post-operative Plan:   Informed Consent: I have reviewed the patients History and Physical, chart, labs and discussed the procedure including the risks, benefits and alternatives for the proposed anesthesia with the patient or authorized representative who has indicated his/her understanding and acceptance.     Plan Discussed with: CRNA  Anesthesia Plan Comments:         Anesthesia Quick Evaluation

## 2017-07-03 NOTE — Anesthesia Procedure Notes (Signed)
Procedure Name: General with mask airway Performed by: Antionio Negron, CRNA Pre-anesthesia Checklist: Patient identified, Patient being monitored, Emergency Drugs available, Timeout performed and Suction available Patient Re-evaluated:Patient Re-evaluated prior to induction Oxygen Delivery Method: Circle system utilized Preoxygenation: Pre-oxygenation with 100% oxygen Induction Type: Combination inhalational/ intravenous induction Ventilation: Mask ventilation without difficulty Dental Injury: Teeth and Oropharynx as per pre-operative assessment        

## 2017-07-03 NOTE — H&P (Signed)
Paper H&P to be scanned into permanent record. H&P reviewed and patient re-examined. No changes. 

## 2017-07-03 NOTE — Anesthesia Postprocedure Evaluation (Signed)
Anesthesia Post Note  Patient: Sabrina Burns  Procedure(s) Performed: REMOVAL OF RETAINED SUTURE FROM LEFT WRIST (Left )  Patient location during evaluation: PACU Anesthesia Type: General Level of consciousness: awake and alert Pain management: pain level controlled Vital Signs Assessment: post-procedure vital signs reviewed and stable Respiratory status: spontaneous breathing, nonlabored ventilation, respiratory function stable and patient connected to nasal cannula oxygen Cardiovascular status: blood pressure returned to baseline and stable Postop Assessment: no apparent nausea or vomiting Anesthetic complications: no    Lumi Winslett

## 2017-07-03 NOTE — Discharge Instructions (Addendum)
General Anesthesia, Adult, Care After These instructions provide you with information about caring for yourself after your procedure. Your health care provider may also give you more specific instructions. Your treatment has been planned according to current medical practices, but problems sometimes occur. Call your health care provider if you have any problems or questions after your procedure. What can I expect after the procedure? After the procedure, it is common to have:  Vomiting.  A sore throat.  Mental slowness.  It is common to feel:  Nauseous.  Cold or shivery.  Sleepy.  Tired.  Sore or achy, even in parts of your body where you did not have surgery.  Follow these instructions at home: For at least 24 hours after the procedure:  Do not: ? Participate in activities where you could fall or become injured. ? Drive. ? Use heavy machinery. ? Drink alcohol. ? Take sleeping pills or medicines that cause drowsiness. ? Make important decisions or sign legal documents. ? Take care of children on your own.  Rest. Eating and drinking  If you vomit, drink water, juice, or soup when you can drink without vomiting.  Drink enough fluid to keep your urine clear or pale yellow.  Make sure you have little or no nausea before eating solid foods.  Follow the diet recommended by your health care provider. General instructions  Have a responsible adult stay with you until you are awake and alert.  Return to your normal activities as told by your health care provider. Ask your health care provider what activities are safe for you.  Take over-the-counter and prescription medicines only as told by your health care provider.  If you smoke, do not smoke without supervision.  Keep all follow-up visits as told by your health care provider. This is important. Contact a health care provider if:  You continue to have nausea or vomiting at home, and medicines are not helpful.  You  cannot drink fluids or start eating again.  You cannot urinate after 8-12 hours.  You develop a skin rash.  You have fever.  You have increasing redness at the site of your procedure. Get help right away if:  You have difficulty breathing.  You have chest pain.  You have unexpected bleeding.  You feel that you are having a life-threatening or urgent problem. This information is not intended to replace advice given to you by your health care provider. Make sure you discuss any questions you have with your health care provider. Document Released: 06/18/2000 Document Revised: 08/15/2015 Document Reviewed: 02/24/2015 Elsevier Interactive Patient Education  2018 [place] Instructions: Keep dressing dry and intact. Keep hand elevated above heart level. May shower after dressing removed on postop day 4 (Sunday). Cover sutures with Band-Aids after drying off. Apply ice to affected area frequently. Take ibuprofen 600 mg TID OR Aleve 2 tabs BID with meals for 5-7 days, then as necessary. Take ES Tylenol or pain medication as prescribed when needed.  Return for follow-up in 10-14 days or as scheduled.

## 2017-07-03 NOTE — Transfer of Care (Signed)
Immediate Anesthesia Transfer of Care Note  Patient: Sabrina Burns  Procedure(s) Performed: REMOVAL OF RETAINED SUTURE FROM LEFT WRIST (Left )  Patient Location: PACU  Anesthesia Type: General  Level of Consciousness: awake, alert  and patient cooperative  Airway and Oxygen Therapy: Patient Spontanous Breathing and Patient connected to supplemental oxygen  Post-op Assessment: Post-op Vital signs reviewed, Patient's Cardiovascular Status Stable, Respiratory Function Stable, Patent Airway and No signs of Nausea or vomiting  Post-op Vital Signs: Reviewed and stable  Complications: No apparent anesthesia complications

## 2017-07-03 NOTE — Op Note (Signed)
07/03/2017  12:52 PM  Patient:   Sabrina Burns  Pre-Op Diagnosis:   Painful retained sutures x2 status post arthroscopically-assisted TFCC repair, left wrist.  Post-Op Diagnosis:   Same  Procedure:   Removal of painful retained sutures x2, left wrist.  Surgeon:   Pascal Lux, MD  Assistant:   None  Anesthesia:   General mask with local  Findings:   As above.  Complications:   None  Fluids:   400 cc crystalloid  EBL:   1 cc  UOP:   None  TT:   None  Drains:   None  Closure:   4-0 Prolene interrupted sutures  Brief Clinical Note:   The patient is a 72 year old female who is now 3.5 months status post an arthroscopically assisted TFCC repair of her left wrist.  The patient has done quite well following the procedure but has noticed a painful raised area on the dorsal aspect of her wrist.  Her history and examination are consistent with a painful retained suture.  The patient presents at this time for removal of this suture.  Procedure:   The patient was brought into the operating room and lain in the supine position.  After adequate general mask anesthesia was obtained, the skin around the painful site was infiltrated with 5 cc of 0.5% Sensorcaine with epinephrine using sterile technique.  The left forearm and hand was prepped with ChloraPrep solution before being draped sterilely.  A surgical timeout was performed verifying the appropriate surgical site before an approximately 7-8 mm incision was made transversely over the painful mass.  This incision was carried down through the subcutaneous tissues using blunt dissection to expose the mass.  Further dissection revealed the knot of the retained suture.  This suture was released and removed in its entirety.  A second suture knot was identified and removed similarly, as two sutures had been used to effect the repair.  The skin was closed using 4-0 Prolene interrupted sutures before a sterile bulky dressing was applied to  the wound.  The patient was then awakened and returned to the recovery room in satisfactory condition after tolerating the procedure well.

## 2017-07-04 ENCOUNTER — Encounter: Payer: Self-pay | Admitting: Surgery

## 2017-07-30 DIAGNOSIS — L570 Actinic keratosis: Secondary | ICD-10-CM | POA: Diagnosis not present

## 2017-07-30 DIAGNOSIS — L249 Irritant contact dermatitis, unspecified cause: Secondary | ICD-10-CM | POA: Diagnosis not present

## 2017-07-30 DIAGNOSIS — X32XXXA Exposure to sunlight, initial encounter: Secondary | ICD-10-CM | POA: Diagnosis not present

## 2017-07-30 DIAGNOSIS — Z08 Encounter for follow-up examination after completed treatment for malignant neoplasm: Secondary | ICD-10-CM | POA: Diagnosis not present

## 2017-07-30 DIAGNOSIS — Z85828 Personal history of other malignant neoplasm of skin: Secondary | ICD-10-CM | POA: Diagnosis not present

## 2017-07-30 DIAGNOSIS — B351 Tinea unguium: Secondary | ICD-10-CM | POA: Diagnosis not present

## 2017-07-30 DIAGNOSIS — I872 Venous insufficiency (chronic) (peripheral): Secondary | ICD-10-CM | POA: Diagnosis not present

## 2017-09-16 DIAGNOSIS — H93299 Other abnormal auditory perceptions, unspecified ear: Secondary | ICD-10-CM | POA: Diagnosis not present

## 2017-09-20 IMAGING — MR MR SHOULDER*R* W/O CM
5 series · 40 of 40 positions shown · non-contrast
Comparison: None.

CLINICAL DATA: Injured in a fall on sidewalk 1 yr ago, c/o RIGHT
shoulder pain with painful AND limited ROM. Symptoms since fall 1
year ago

EXAM:
MRI OF THE RIGHT SHOULDER WITHOUT CONTRAST
TECHNIQUE: Multiplanar, multisequence MR imaging of the shoulder was performed.
No intravenous contrast was administered.

[Series 3: T2 fat-sat · axial · 4.0mm · 0.47mm/px · z∈[-32,+74]mm · 8 of 25 slices shown (1 of 3)]
[im 1/25]
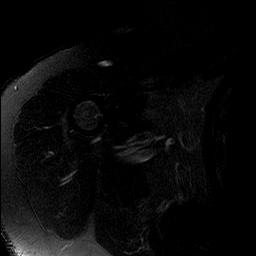
[im 4/25]
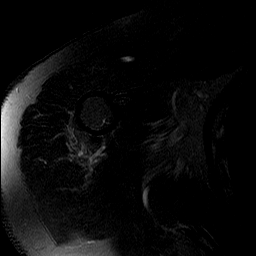
[im 7/25]
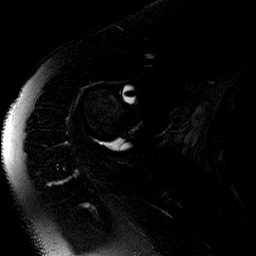
[im 11/25]
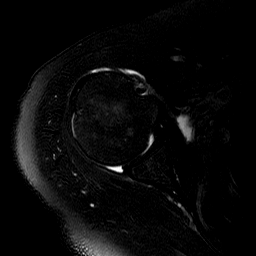
[im 14/25]
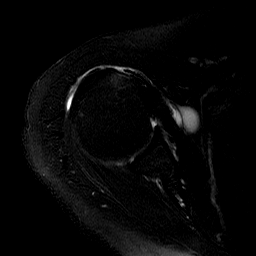
[im 18/25]
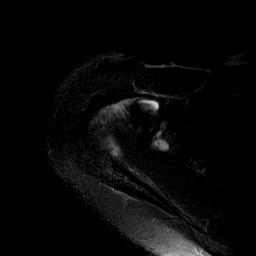
[im 21/25]
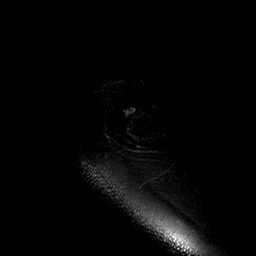
[im 25/25]
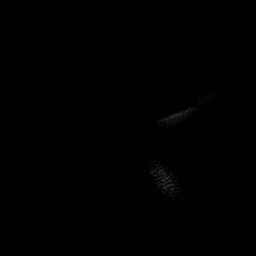

[Series 4: T2 fat-sat · oblique · 4.0mm · 0.62mm/px · 8 of 23 slices shown (2 of 3)]
[im 1/23]
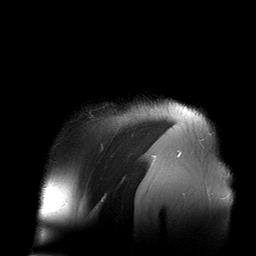
[im 4/23]
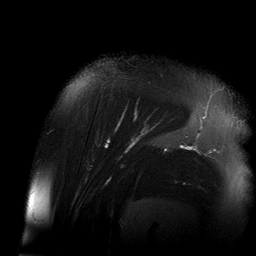
[im 7/23]
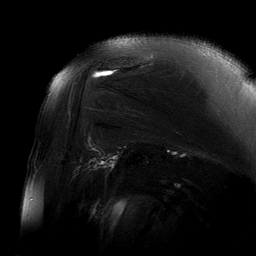
[im 10/23]
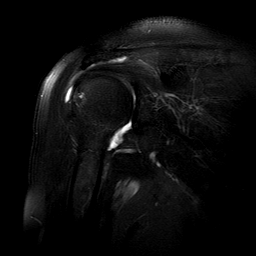
[im 13/23]
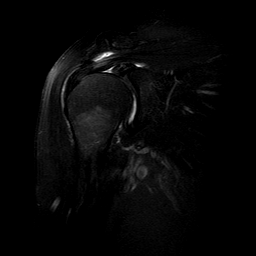
[im 16/23]
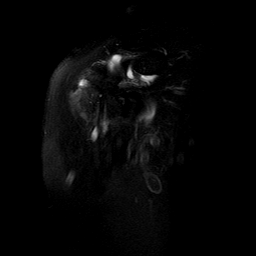
[im 19/23]
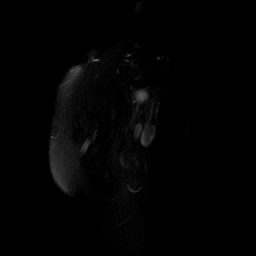
[im 23/23]
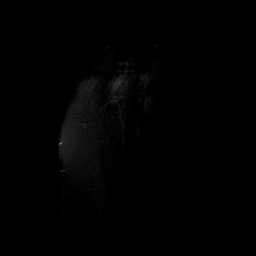

[Series 5: PD · oblique · 4.0mm · 0.62mm/px · 8 of 23 slices shown]
[im 1/23]
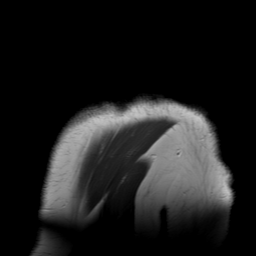
[im 4/23]
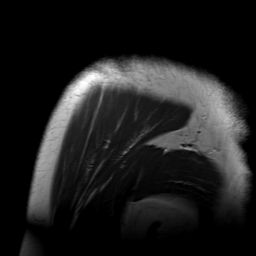
[im 7/23]
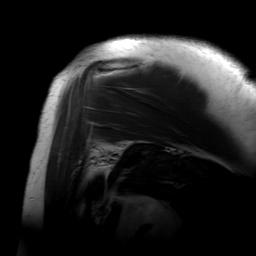
[im 10/23]
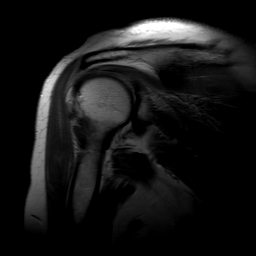
[im 13/23]
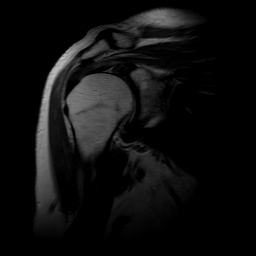
[im 16/23]
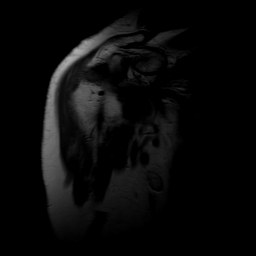
[im 19/23]
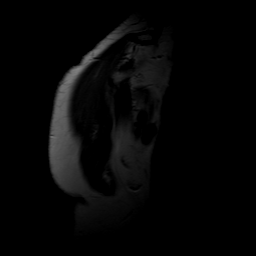
[im 23/23]
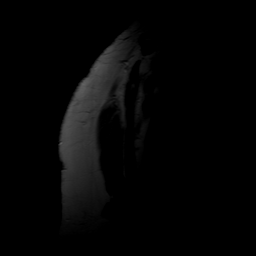

[Series 6: T1 · oblique · 4.0mm · 0.62mm/px · 8 of 23 slices shown]
[im 1/23]
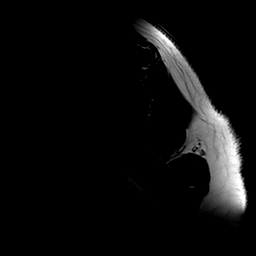
[im 4/23]
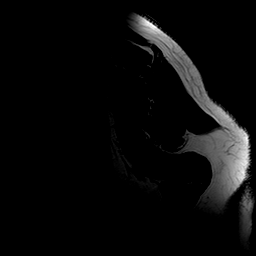
[im 7/23]
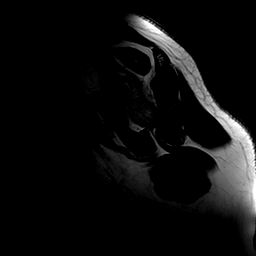
[im 10/23]
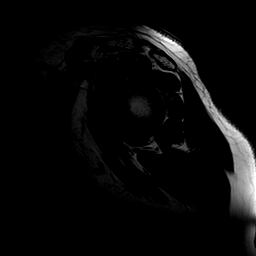
[im 13/23]
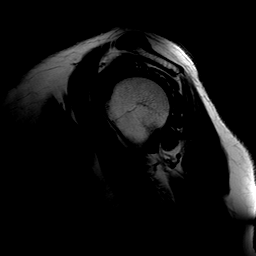
[im 16/23]
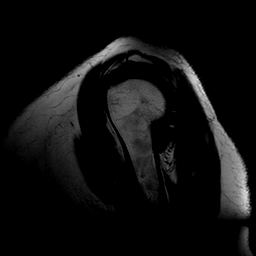
[im 19/23]
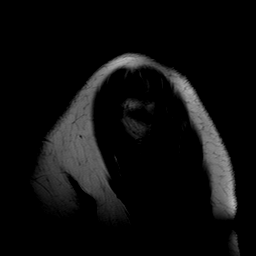
[im 23/23]
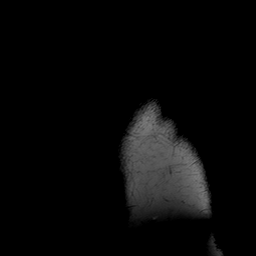

[Series 7: T2 fat-sat · oblique · 4.0mm · 0.62mm/px · 8 of 23 slices shown (3 of 3)]
[im 1/23]
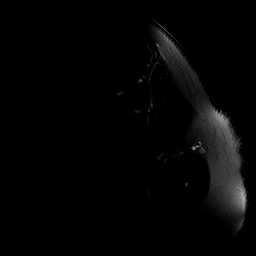
[im 4/23]
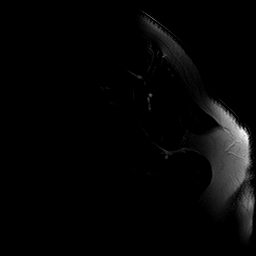
[im 7/23]
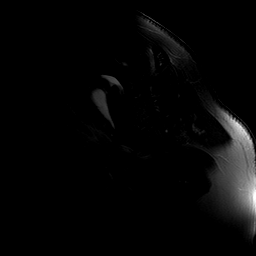
[im 10/23]
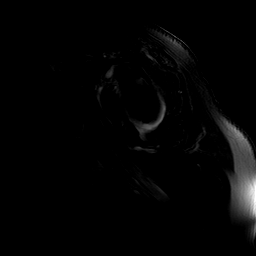
[im 13/23]
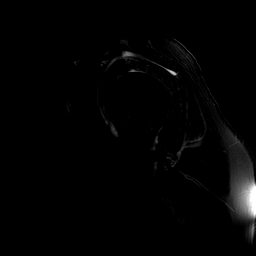
[im 16/23]
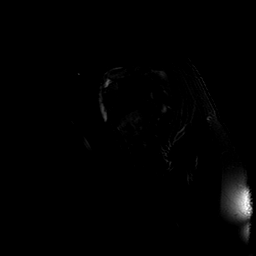
[im 19/23]
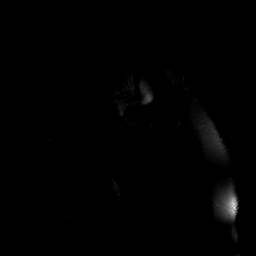
[im 23/23]
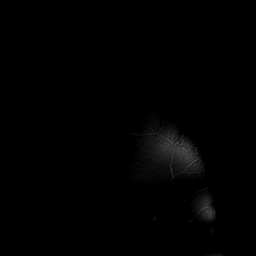

[40 of 40 positions shown; findings below may reference images not displayed]

FINDINGS: Rotator cuff: Severe tendinosis of the supraspinatus tendon with a
small high-grade partial-thickness bursal surface tear of the
anterior-most fibers. Mild tendinosis of the infraspinatus tendon.
Teres minor tendon is intact. Subscapularis tendon is intact.

Muscles: No atrophy or fatty replacement of nor abnormal signal
within, the muscles of the rotator cuff.

Biceps long head: Moderate tendinosis of the intraarticular portion
of the long head of the biceps tendon.

Acromioclavicular Joint: Mild degenerative changes of the
acromioclavicular joint. Type I acromion. Small amount of
subacromial/subdeltoid bursal fluid.

Glenohumeral Joint: No joint effusion.  No chondral defect.

Labrum: Grossly intact, but evaluation is limited by lack of
intraarticular fluid.

Bones: No focal marrow signal abnormality. No fracture or
dislocation.
IMPRESSION: 1. Severe tendinosis of the supraspinatus tendon with a small
high-grade partial-thickness bursal surface tear of the
anterior-most fibers.
2. Mild tendinosis of the infraspinatus tendon.
3. Moderate tendinosis of the intraarticular portion of the long
head of the biceps tendon.

## 2018-03-07 ENCOUNTER — Other Ambulatory Visit: Payer: Self-pay | Admitting: Internal Medicine

## 2018-03-07 DIAGNOSIS — E785 Hyperlipidemia, unspecified: Secondary | ICD-10-CM | POA: Diagnosis not present

## 2018-03-07 DIAGNOSIS — Z0000 Encounter for general adult medical examination without abnormal findings: Secondary | ICD-10-CM | POA: Diagnosis not present

## 2018-03-07 DIAGNOSIS — Z1239 Encounter for other screening for malignant neoplasm of breast: Secondary | ICD-10-CM | POA: Diagnosis not present

## 2018-03-07 DIAGNOSIS — F329 Major depressive disorder, single episode, unspecified: Secondary | ICD-10-CM | POA: Diagnosis not present

## 2018-03-07 DIAGNOSIS — Z1231 Encounter for screening mammogram for malignant neoplasm of breast: Secondary | ICD-10-CM

## 2018-03-07 DIAGNOSIS — Z0001 Encounter for general adult medical examination with abnormal findings: Secondary | ICD-10-CM | POA: Diagnosis not present

## 2018-03-11 DIAGNOSIS — F329 Major depressive disorder, single episode, unspecified: Secondary | ICD-10-CM | POA: Diagnosis not present

## 2018-03-11 DIAGNOSIS — E785 Hyperlipidemia, unspecified: Secondary | ICD-10-CM | POA: Diagnosis not present

## 2018-03-11 DIAGNOSIS — Z0000 Encounter for general adult medical examination without abnormal findings: Secondary | ICD-10-CM | POA: Diagnosis not present

## 2018-03-11 DIAGNOSIS — Z1239 Encounter for other screening for malignant neoplasm of breast: Secondary | ICD-10-CM | POA: Diagnosis not present

## 2018-03-11 DIAGNOSIS — Z0001 Encounter for general adult medical examination with abnormal findings: Secondary | ICD-10-CM | POA: Diagnosis not present

## 2018-03-12 ENCOUNTER — Ambulatory Visit
Admission: RE | Admit: 2018-03-12 | Discharge: 2018-03-12 | Disposition: A | Discharge status: Home / Self Care | Payer: PPO | Source: Ambulatory Visit | Attending: Internal Medicine | Admitting: Internal Medicine

## 2018-03-12 DIAGNOSIS — Z1231 Encounter for screening mammogram for malignant neoplasm of breast: Secondary | ICD-10-CM | POA: Diagnosis not present

## 2018-03-14 DIAGNOSIS — H40053 Ocular hypertension, bilateral: Secondary | ICD-10-CM | POA: Diagnosis not present

## 2018-04-01 DIAGNOSIS — D2271 Melanocytic nevi of right lower limb, including hip: Secondary | ICD-10-CM | POA: Diagnosis not present

## 2018-04-01 DIAGNOSIS — X32XXXA Exposure to sunlight, initial encounter: Secondary | ICD-10-CM | POA: Diagnosis not present

## 2018-04-01 DIAGNOSIS — Z85828 Personal history of other malignant neoplasm of skin: Secondary | ICD-10-CM | POA: Diagnosis not present

## 2018-04-01 DIAGNOSIS — D2262 Melanocytic nevi of left upper limb, including shoulder: Secondary | ICD-10-CM | POA: Diagnosis not present

## 2018-04-01 DIAGNOSIS — D2272 Melanocytic nevi of left lower limb, including hip: Secondary | ICD-10-CM | POA: Diagnosis not present

## 2018-04-01 DIAGNOSIS — L570 Actinic keratosis: Secondary | ICD-10-CM | POA: Diagnosis not present

## 2018-04-01 DIAGNOSIS — D2261 Melanocytic nevi of right upper limb, including shoulder: Secondary | ICD-10-CM | POA: Diagnosis not present

## 2018-04-01 DIAGNOSIS — Z08 Encounter for follow-up examination after completed treatment for malignant neoplasm: Secondary | ICD-10-CM | POA: Diagnosis not present

## 2018-04-01 DIAGNOSIS — D225 Melanocytic nevi of trunk: Secondary | ICD-10-CM | POA: Diagnosis not present

## 2018-05-29 ENCOUNTER — Emergency Department: Payer: PPO

## 2018-05-29 ENCOUNTER — Emergency Department
Admission: EM | Admit: 2018-05-29 | Discharge: 2018-05-29 | Disposition: A | Discharge status: Home / Self Care | Payer: PPO | Attending: Emergency Medicine | Admitting: Emergency Medicine

## 2018-05-29 ENCOUNTER — Encounter: Payer: Self-pay | Admitting: Emergency Medicine

## 2018-05-29 ENCOUNTER — Other Ambulatory Visit: Payer: Self-pay

## 2018-05-29 DIAGNOSIS — S7002XA Contusion of left hip, initial encounter: Secondary | ICD-10-CM | POA: Diagnosis not present

## 2018-05-29 DIAGNOSIS — S79912A Unspecified injury of left hip, initial encounter: Secondary | ICD-10-CM | POA: Diagnosis not present

## 2018-05-29 DIAGNOSIS — Y929 Unspecified place or not applicable: Secondary | ICD-10-CM | POA: Diagnosis not present

## 2018-05-29 DIAGNOSIS — Y9389 Activity, other specified: Secondary | ICD-10-CM | POA: Insufficient documentation

## 2018-05-29 DIAGNOSIS — R9431 Abnormal electrocardiogram [ECG] [EKG]: Secondary | ICD-10-CM | POA: Diagnosis not present

## 2018-05-29 DIAGNOSIS — M79605 Pain in left leg: Secondary | ICD-10-CM | POA: Diagnosis not present

## 2018-05-29 DIAGNOSIS — Y999 Unspecified external cause status: Secondary | ICD-10-CM | POA: Diagnosis not present

## 2018-05-29 DIAGNOSIS — W010XXA Fall on same level from slipping, tripping and stumbling without subsequent striking against object, initial encounter: Secondary | ICD-10-CM | POA: Insufficient documentation

## 2018-05-29 DIAGNOSIS — M25552 Pain in left hip: Secondary | ICD-10-CM | POA: Diagnosis not present

## 2018-05-29 DIAGNOSIS — Z136 Encounter for screening for cardiovascular disorders: Secondary | ICD-10-CM | POA: Diagnosis not present

## 2018-05-29 DIAGNOSIS — Z79899 Other long term (current) drug therapy: Secondary | ICD-10-CM | POA: Diagnosis not present

## 2018-05-29 DIAGNOSIS — W19XXXA Unspecified fall, initial encounter: Secondary | ICD-10-CM

## 2018-05-29 DIAGNOSIS — R52 Pain, unspecified: Secondary | ICD-10-CM | POA: Diagnosis not present

## 2018-05-29 LAB — CBC WITH DIFFERENTIAL/PLATELET
Abs Immature Granulocytes: 0.01 10*3/uL (ref 0.00–0.07)
BASOS ABS: 0 10*3/uL (ref 0.0–0.1)
BASOS PCT: 1 %
EOS ABS: 0.1 10*3/uL (ref 0.0–0.5)
EOS PCT: 2 %
HEMATOCRIT: 39.9 % (ref 36.0–46.0)
Hemoglobin: 13.6 g/dL (ref 12.0–15.0)
IMMATURE GRANULOCYTES: 0 %
LYMPHS ABS: 1.4 10*3/uL (ref 0.7–4.0)
Lymphocytes Relative: 29 %
MCH: 31 pg (ref 26.0–34.0)
MCHC: 34.1 g/dL (ref 30.0–36.0)
MCV: 90.9 fL (ref 80.0–100.0)
Monocytes Absolute: 0.5 10*3/uL (ref 0.1–1.0)
Monocytes Relative: 11 %
NEUTROS PCT: 57 %
NRBC: 0 % (ref 0.0–0.2)
Neutro Abs: 2.8 10*3/uL (ref 1.7–7.7)
PLATELETS: 208 10*3/uL (ref 150–400)
RBC: 4.39 MIL/uL (ref 3.87–5.11)
RDW: 11.9 % (ref 11.5–15.5)
WBC: 4.8 10*3/uL (ref 4.0–10.5)

## 2018-05-29 LAB — PROTIME-INR
INR: 1 (ref 0.8–1.2)
PROTHROMBIN TIME: 12.8 s (ref 11.4–15.2)

## 2018-05-29 LAB — BASIC METABOLIC PANEL
ANION GAP: 9 (ref 5–15)
BUN: 19 mg/dL (ref 8–23)
CALCIUM: 9.3 mg/dL (ref 8.9–10.3)
CO2: 24 mmol/L (ref 22–32)
Chloride: 107 mmol/L (ref 98–111)
Creatinine, Ser: 0.82 mg/dL (ref 0.44–1.00)
GFR calc non Af Amer: >60 mL/min (ref >60)
Glucose, Bld: 98 mg/dL (ref 70–99)
Potassium: 3.7 mmol/L (ref 3.5–5.1)
SODIUM: 140 mmol/L (ref 135–145)

## 2018-05-29 LAB — TYPE AND SCREEN
ABO/RH(D): O POS
Antibody Screen: NEGATIVE

## 2018-05-29 MED ORDER — TRAMADOL HCL 50 MG PO TABS: 50.0000 mg | ORAL_TABLET | Freq: Four times a day (QID) | ORAL | 0 refills | Status: DC | PRN

## 2018-05-29 NOTE — ED Provider Notes (Signed)
[facility] Emergency Department Provider Note   ____________________________________________    I have reviewed the triage vital signs and the nursing notes.   HISTORY  Chief Complaint Fall and Hip Pain     HPI Sabrina Burns is a 73 y.o. female who presents with complaints of left hip pain.  Patient reports she lost her balance and fell onto her left hip.  She complains of sharp pain in her left buttock.  Especially when she moves her left leg.  No other injuries reported.  She did not hit her head.  No abdominal pain nausea or vomiting.  EMS brought here here straight after the fall  Past Medical History:  Diagnosis Date  . Cancer ([place]) 02/2017   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  . Complication of anesthesia    hypotension  with bladder surgery.  . Depression   . H/O: rheumatic fever   . Hypercholesteremia unkn  . Mitral valve prolapse   . Osteoporosis     There are no active problems to display for this patient.   Past Surgical History:  Procedure Laterality Date  . ABDOMINAL HYSTERECTOMY    . BACK SURGERY     lumbar  . COLONOSCOPY    . FOOT SURGERY Right   . pelvic sling    . SHOULDER ARTHROSCOPY WITH OPEN ROTATOR CUFF REPAIR Right 09/15/2015   Procedure: SHOULDER ARTHROSCOPY WITH OPEN ROTATOR CUFF REPAIR;  Surgeon: Corky Mull, MD;  Location: [facility] ORS;  Service: Orthopedics;  Laterality: Right;  . SUTURE REMOVAL Left 07/03/2017   Procedure: REMOVAL OF RETAINED SUTURE FROM LEFT WRIST;  Surgeon: Corky Mull, MD;  Location: [place];  Service: Orthopedics;  Laterality: Left;  . TONSILLECTOMY    . TUBAL LIGATION    . WRIST ARTHROSCOPY WITH FOVEAL TRIANGULAR FIBROCARTILAGE COMPLEX REPAIR Left 03/28/2017   Procedure: WRIST ARTHROSCOPY WITH DEBRIDEMENT AND REPAIR OF TFCC TEAR;  Surgeon: Corky Mull, MD;  Location: [facility] ORS;  Service: Orthopedics;  Laterality: Left;    Prior to Admission medications   Medication Sig Start  Date End Date Taking? Authorizing Provider  citalopram (CELEXA) 20 MG tablet Take 20 mg by mouth daily. In am.    [provider]  naproxen sodium (ALEVE) 220 MG tablet Take 440 mg by mouth 2 (two) times daily as needed (for pain or headache).    [provider]  oxyCODONE (ROXICODONE) 5 MG immediate release tablet Take 1-2 tablets (5-10 mg total) by mouth every 4 (four) hours as needed. Patient not taking: Reported on 05/15/2017 03/28/17   Poggi, Marshall Cork, MD  simvastatin (ZOCOR) 20 MG tablet Take 20 mg by mouth daily at 6 PM.     [provider]  traMADol (ULTRAM) 50 MG tablet Take 1 tablet (50 mg total) by mouth every 6 (six) hours as needed. 05/29/18 05/29/19  Lavonia Drafts, MD     Allergies Patient has no known allergies.  Family History  Problem Relation Age of Onset  . Breast cancer Maternal Aunt   . Breast cancer Paternal Grandmother     Social History Social History   Tobacco Use  . Smoking status: Never Smoker  . Smokeless tobacco: Never Used  Substance Use Topics  . Alcohol use: No  . Drug use: No    Review of Systems  Constitutional: No dizziness Eyes: No visual changes.  ENT: No neck pain Cardiovascular: Denies chest pain. Respiratory: Denies shortness of breath. Gastrointestinal: No abdominal pain.  No nausea, no vomiting.   Genitourinary: No groin injury Musculoskeletal: As above Skin: Negative for rash. Neurological: Negative for headaches or weakness   ____________________________________________   PHYSICAL EXAM:  VITAL SIGNS: ED Triage Vitals  Enc Vitals Group     BP 05/29/18 1649 (!) 145/78     Pulse --      Resp 05/29/18 1649 18     Temp 05/29/18 1649 97.6 F (36.4 C)     Temp Source 05/29/18 1649 Oral     SpO2 05/29/18 1649 100 %     Weight 05/29/18 1650 59 kg (130 lb)     Height 05/29/18 1650 1.575 m (5\' 2" )     Head Circumference --      Peak Flow --      Pain Score 05/29/18 1650 6     Pain Loc --      Pain Edu?  --      Excl. in [place]? --     Constitutional: Alert and oriented. Eyes: Conjunctivae are normal.  Head: Atraumatic Nose: No congestion/rhinnorhea. Mouth/Throat: Mucous membranes are moist.   Neck:  Painless ROM, no vertebral transposed patient Cardiovascular: Normal rate, regular rhythm. Grossly normal heart sounds.  Good peripheral circulation. Respiratory: Normal respiratory effort.  No retractions. Lungs CTAB. Gastrointestinal: Soft and nontender. No distention.   Musculoskeletal: All extremities normal range of motion except left lower leg, pain with axial load on the left hip, no significant bruising or tenderness palpation along the left hip Neurologic:  Normal speech and language. No gross focal neurologic deficits are appreciated.  Skin:  Skin is warm, dry and intact. No rash noted. Psychiatric: Mood and affect are normal. Speech and behavior are normal.  ____________________________________________   LABS (all labs ordered are listed, but only abnormal results are displayed)  Labs Reviewed  BASIC METABOLIC PANEL  CBC WITH DIFFERENTIAL/PLATELET  PROTIME-INR  TYPE AND SCREEN   ____________________________________________  EKG  ED ECG REPORT I, Lavonia Drafts, the attending physician, personally viewed and interpreted this ECG.  Date: 05/29/2018  Rhythm: normal sinus rhythm QRS Axis: normal Intervals: normal ST/T Wave abnormalities: normal Narrative Interpretation: no evidence of acute ischemia  ____________________________________________  RADIOLOGY  Left hip ____________________________________________   PROCEDURES  Procedure(s) performed: No  Procedures   Critical Care performed: No ____________________________________________   INITIAL IMPRESSION / ASSESSMENT AND PLAN / ED COURSE  Pertinent labs & imaging results that were available during my care of the patient were reviewed by me and considered in my medical decision making (see chart for  details).  Patient presents after fall, she complains primarily of hip pain, concerning for hip fracture, will obtain x-rays, check labs, she received IV fentanyl, she does not want any more pain medication at this time  X-ray negative for fracture however the patient has difficulty bearing weight on her left hip, sent for CT  CT negative for fracture as well, suspect hip contusion as the cause of her pain  Patient is able to ambulate with a walker, given that she feels comfortable going home which I think is reasonable, close follow-up with orthopedics    ____________________________________________   FINAL CLINICAL IMPRESSION(S) / ED DIAGNOSES  Final diagnoses:  Fall, initial encounter  Contusion of left hip, initial encounter        Note:  This document was prepared using Dragon voice recognition software and may include unintentional dictation errors.   Lavonia Drafts, MD 05/29/18 2013

## 2018-05-29 NOTE — ED Notes (Signed)
Pt was able to successfully ambulate around the room with a walker.

## 2018-05-29 NOTE — ED Triage Notes (Signed)
PT to ER via EMS from home after tripping and falling, hitting left hip and ribs on a desk.  PT with c/o pain to left hip that radiates into leg.  PT denies hitting head, denies LOC.

## 2018-09-10 DIAGNOSIS — H40053 Ocular hypertension, bilateral: Secondary | ICD-10-CM | POA: Diagnosis not present

## 2018-09-18 DIAGNOSIS — H40053 Ocular hypertension, bilateral: Secondary | ICD-10-CM | POA: Diagnosis not present

## 2018-09-25 DIAGNOSIS — H40053 Ocular hypertension, bilateral: Secondary | ICD-10-CM | POA: Diagnosis not present

## 2018-10-28 DIAGNOSIS — H2511 Age-related nuclear cataract, right eye: Secondary | ICD-10-CM | POA: Diagnosis not present

## 2018-11-07 ENCOUNTER — Other Ambulatory Visit: Payer: Self-pay

## 2018-11-07 ENCOUNTER — Other Ambulatory Visit
Admission: RE | Admit: 2018-11-07 | Discharge: 2018-11-07 | Disposition: A | Discharge status: Home / Self Care | Payer: PPO | Source: Ambulatory Visit | Attending: Ophthalmology | Admitting: Ophthalmology

## 2018-11-07 DIAGNOSIS — Z20828 Contact with and (suspected) exposure to other viral communicable diseases: Secondary | ICD-10-CM | POA: Insufficient documentation

## 2018-11-07 DIAGNOSIS — Z01812 Encounter for preprocedural laboratory examination: Secondary | ICD-10-CM | POA: Insufficient documentation

## 2018-11-08 LAB — SARS CORONAVIRUS 2 (TAT 6-24 HRS): SARS Coronavirus 2: NEGATIVE

## 2018-11-10 NOTE — Discharge Instructions (Signed)

## 2018-11-12 ENCOUNTER — Other Ambulatory Visit: Payer: Self-pay

## 2018-11-12 ENCOUNTER — Ambulatory Visit: Payer: PPO | Admitting: Anesthesiology

## 2018-11-12 ENCOUNTER — Ambulatory Visit
Admission: RE | Admit: 2018-11-12 | Discharge: 2018-11-12 | Disposition: A | Discharge status: Home / Self Care | Payer: PPO | Attending: Ophthalmology | Admitting: Ophthalmology

## 2018-11-12 ENCOUNTER — Encounter
Admission: RE | Disposition: A | Discharge status: Home / Self Care | Payer: Self-pay | Source: Home / Self Care | Attending: Ophthalmology

## 2018-11-12 DIAGNOSIS — I341 Nonrheumatic mitral (valve) prolapse: Secondary | ICD-10-CM | POA: Insufficient documentation

## 2018-11-12 DIAGNOSIS — H2511 Age-related nuclear cataract, right eye: Secondary | ICD-10-CM | POA: Diagnosis not present

## 2018-11-12 DIAGNOSIS — H25811 Combined forms of age-related cataract, right eye: Secondary | ICD-10-CM | POA: Diagnosis not present

## 2018-11-12 HISTORY — PX: CATARACT EXTRACTION W/PHACO: SHX586

## 2018-11-12 SURGERY — PHACOEMULSIFICATION, CATARACT, WITH IOL INSERTION
Anesthesia: Monitor Anesthesia Care | Site: Eye | Laterality: Right

## 2018-11-12 MED ORDER — ACETAMINOPHEN 160 MG/5ML PO SOLN: 325.0000 mg | Freq: Once | ORAL | Status: DC

## 2018-11-12 MED ORDER — TETRACAINE HCL 0.5 % OP SOLN
1.0000 [drp] | OPHTHALMIC | Status: DC | PRN
  Administered 2018-11-12 (×3): 1 [drp] via OPHTHALMIC

## 2018-11-12 MED ORDER — ARMC OPHTHALMIC DILATING DROPS
1.0000 "application " | OPHTHALMIC | Status: DC | PRN
  Administered 2018-11-12 (×3): 1 via OPHTHALMIC

## 2018-11-12 MED ORDER — NA HYALUR & NA CHOND-NA HYALUR 0.4-0.35 ML IO KIT
PACK | INTRAOCULAR | Status: DC | PRN
  Administered 2018-11-12: 1 mL via INTRAOCULAR

## 2018-11-12 MED ORDER — FENTANYL CITRATE (PF) 100 MCG/2ML IJ SOLN
INTRAMUSCULAR | Status: DC | PRN
  Administered 2018-11-12 (×2): 50 ug via INTRAVENOUS

## 2018-11-12 MED ORDER — MOXIFLOXACIN HCL 0.5 % OP SOLN
1.0000 [drp] | OPHTHALMIC | Status: DC | PRN
  Administered 2018-11-12 (×3): 1 [drp] via OPHTHALMIC

## 2018-11-12 MED ORDER — ACETAMINOPHEN 325 MG PO TABS: 325.0000 mg | ORAL_TABLET | Freq: Once | ORAL | Status: DC

## 2018-11-12 MED ORDER — MIDAZOLAM HCL 2 MG/2ML IJ SOLN
INTRAMUSCULAR | Status: DC | PRN
  Administered 2018-11-12: 2 mg via INTRAVENOUS

## 2018-11-12 MED ORDER — CEFUROXIME OPHTHALMIC INJECTION 1 MG/0.1 ML
INJECTION | OPHTHALMIC | Status: DC | PRN
  Administered 2018-11-12: 0.1 mL via INTRACAMERAL

## 2018-11-12 MED ORDER — EPINEPHRINE PF 1 MG/ML IJ SOLN
INTRAOCULAR | Status: DC | PRN
  Administered 2018-11-12: 65 mL via OPHTHALMIC

## 2018-11-12 MED ORDER — BRIMONIDINE TARTRATE-TIMOLOL 0.2-0.5 % OP SOLN
OPHTHALMIC | Status: DC | PRN
  Administered 2018-11-12: 1 [drp] via OPHTHALMIC

## 2018-11-12 MED ORDER — LIDOCAINE HCL (PF) 2 % IJ SOLN
INTRAOCULAR | Status: DC | PRN
  Administered 2018-11-12: 1 mL

## 2018-11-12 SURGICAL SUPPLY — 26 items
CANNULA ANT/CHMB 27G (MISCELLANEOUS) ×1 IMPLANT
CANNULA ANT/CHMB 27GA (MISCELLANEOUS) ×2 IMPLANT
GLOVE SURG LX 7.5 STRW (GLOVE) ×2
GLOVE SURG LX STRL 7.5 STRW (GLOVE) ×1 IMPLANT
GLOVE SURG TRIUMPH 8.0 PF LTX (GLOVE) ×2 IMPLANT
GOWN STRL REUS W/ TWL LRG LVL3 (GOWN DISPOSABLE) ×2 IMPLANT
GOWN STRL REUS W/TWL LRG LVL3 (GOWN DISPOSABLE) ×2
LENS IOL TECNIS ITEC 12.0 (Intraocular Lens) ×1 IMPLANT
MARKER SKIN DUAL TIP RULER LAB (MISCELLANEOUS) ×2 IMPLANT
NDL FILTER BLUNT 18X1 1/2 (NEEDLE) IMPLANT
NDL RETROBULBAR .5 NSTRL (NEEDLE) IMPLANT
NEEDLE FILTER BLUNT 18X 1/2SAF (NEEDLE)
NEEDLE FILTER BLUNT 18X1 1/2 (NEEDLE) IMPLANT
PACK CATARACT BRASINGTON (MISCELLANEOUS) ×2 IMPLANT
PACK EYE AFTER SURG (MISCELLANEOUS) ×2 IMPLANT
PACK OPTHALMIC (MISCELLANEOUS) ×2 IMPLANT
RING MALYGIN 7.0 (MISCELLANEOUS) IMPLANT
SUT ETHILON 10-0 CS-B-6CS-B-6 (SUTURE)
SUT VICRYL  9 0 (SUTURE)
SUT VICRYL 9 0 (SUTURE) IMPLANT
SUTURE EHLN 10-0 CS-B-6CS-B-6 (SUTURE) IMPLANT
SYR 3ML LL SCALE MARK (SYRINGE) ×2 IMPLANT
SYR 5ML LL (SYRINGE) IMPLANT
SYR TB 1ML LUER SLIP (SYRINGE) ×2 IMPLANT
WATER STERILE IRR 500ML POUR (IV SOLUTION) ×2 IMPLANT
WIPE NON LINTING 3.25X3.25 (MISCELLANEOUS) ×2 IMPLANT

## 2018-11-12 NOTE — Anesthesia Preprocedure Evaluation (Signed)
Anesthesia Evaluation  Patient identified by MRN, date of birth, ID band Patient awake    Reviewed: Allergy & Precautions, H&P , NPO status , Patient's Chart, lab work & pertinent test results  Airway Mallampati: II  TM Distance: >3 FB Neck ROM: full    Dental no notable dental hx.    Pulmonary    Pulmonary exam normal breath sounds clear to auscultation       Cardiovascular Normal cardiovascular exam+ Valvular Problems/Murmurs MVP  Rhythm:regular Rate:Normal     Neuro/Psych PSYCHIATRIC DISORDERS    GI/Hepatic   Endo/Other    Renal/GU      Musculoskeletal   Abdominal   Peds  Hematology   Anesthesia Other Findings   Reproductive/Obstetrics                             Anesthesia Physical Anesthesia Plan  ASA: II  Anesthesia Plan: MAC   Post-op Pain Management:    Induction:   PONV Risk Score and Plan: 2 and TIVA, Midazolam and Treatment may vary due to age or medical condition  Airway Management Planned:   Additional Equipment:   Intra-op Plan:   Post-operative Plan:   Informed Consent: I have reviewed the patients History and Physical, chart, labs and discussed the procedure including the risks, benefits and alternatives for the proposed anesthesia with the patient or authorized representative who has indicated his/her understanding and acceptance.       Plan Discussed with: CRNA  Anesthesia Plan Comments:         Anesthesia Quick Evaluation

## 2018-11-12 NOTE — Op Note (Signed)
LOCATION:  [place]   PREOPERATIVE DIAGNOSIS:    Nuclear sclerotic cataract right eye. H25.11   POSTOPERATIVE DIAGNOSIS:  Nuclear sclerotic cataract right eye.     PROCEDURE:  Phacoemusification with posterior chamber intraocular lens placement of the right eye   LENS:   Implant Name Type Inv. Item Serial No. Manufacturer Lot No. LRB No. Used Action  LENS IOL DIOP 12.0 - [id number] Intraocular Lens LENS IOL DIOP 12.0 [id number] AMO  Right 1 Implanted        ULTRASOUND TIME: 13 % of 1 minutes, 4 seconds.  CDE 8.4   SURGEON:  Wyonia Hough, MD   ANESTHESIA:  Topical with tetracaine drops and 2% Xylocaine jelly, augmented with 1% preservative-free intracameral lidocaine.    COMPLICATIONS:  None.   DESCRIPTION OF PROCEDURE:  The patient was identified in the holding room and transported to the operating room and placed in the supine position under the operating microscope.  The right eye was identified as the operative eye and it was prepped and draped in the usual sterile ophthalmic fashion.   A 1 millimeter clear-corneal paracentesis was made at the 12:00 position.  0.5 ml of preservative-free 1% lidocaine was injected into the anterior chamber. The anterior chamber was filled with Viscoat viscoelastic.  A 2.4 millimeter keratome was used to make a near-clear corneal incision at the 9:00 position.  A curvilinear capsulorrhexis was made with a cystotome and capsulorrhexis forceps.  Balanced salt solution was used to hydrodissect and hydrodelineate the nucleus.   Phacoemulsification was then used in stop and chop fashion to remove the lens nucleus and epinucleus.  The remaining cortex was then removed using the irrigation and aspiration handpiece. Provisc was then placed into the capsular bag to distend it for lens placement.  A lens was then injected into the capsular bag.  The remaining viscoelastic was aspirated.   Wounds were hydrated with balanced salt solution.   The anterior chamber was inflated to a physiologic pressure with balanced salt solution.  No wound leaks were noted. Cefuroxime 0.1 ml of a 10mg /ml solution was injected into the anterior chamber for a dose of 1 mg of intracameral antibiotic at the completion of the case.   Timolol and Brimonidine drops were applied to the eye.  The patient was taken to the recovery room in stable condition without complications of anesthesia or surgery.   Starkisha Tullis 11/12/2018, 10:36 AM

## 2018-11-12 NOTE — Anesthesia Postprocedure Evaluation (Signed)
Anesthesia Post Note  Patient: Sabrina Burns  Procedure(s) Performed: CATARACT EXTRACTION PHACO AND INTRAOCULAR LENS PLACEMENT ([place]) RIGHT (Right Eye)  Patient location during evaluation: PACU Anesthesia Type: MAC Level of consciousness: awake and alert and oriented Pain management: satisfactory to patient Vital Signs Assessment: post-procedure vital signs reviewed and stable Respiratory status: spontaneous breathing, nonlabored ventilation and respiratory function stable Cardiovascular status: blood pressure returned to baseline and stable Postop Assessment: Adequate PO intake and No signs of nausea or vomiting Anesthetic complications: no    Raliegh Ip

## 2018-11-12 NOTE — Transfer of Care (Signed)
Immediate Anesthesia Transfer of Care Note  Patient: Sabrina Burns  Procedure(s) Performed: CATARACT EXTRACTION PHACO AND INTRAOCULAR LENS PLACEMENT ([facility]) RIGHT (Right Eye)  Patient Location: PACU  Anesthesia Type: MAC  Level of Consciousness: awake, alert  and patient cooperative  Airway and Oxygen Therapy: Patient Spontanous Breathing and Patient connected to supplemental oxygen  Post-op Assessment: Post-op Vital signs reviewed, Patient's Cardiovascular Status Stable, Respiratory Function Stable, Patent Airway and No signs of Nausea or vomiting  Post-op Vital Signs: Reviewed and stable  Complications: No apparent anesthesia complications

## 2018-11-12 NOTE — H&P (Signed)

## 2018-11-13 ENCOUNTER — Encounter: Payer: Self-pay | Admitting: Ophthalmology

## 2018-11-21 DIAGNOSIS — H2512 Age-related nuclear cataract, left eye: Secondary | ICD-10-CM | POA: Diagnosis not present

## 2018-11-21 DIAGNOSIS — R011 Cardiac murmur, unspecified: Secondary | ICD-10-CM | POA: Diagnosis not present

## 2018-11-25 ENCOUNTER — Other Ambulatory Visit: Payer: Self-pay

## 2018-11-25 ENCOUNTER — Encounter: Payer: Self-pay | Admitting: *Deleted

## 2018-11-27 NOTE — Discharge Instructions (Signed)
General Anesthesia, Adult, Care After °This sheet gives you information about how to care for yourself after your procedure. Your health care provider may also give you more specific instructions. If you have problems or questions, contact your health care provider. °What can I expect after the procedure? °After the procedure, the following side effects are common: °· Pain or discomfort at the IV site. °· Nausea. °· Vomiting. °· Sore throat. °· Trouble concentrating. °· Feeling cold or chills. °· Weak or tired. °· Sleepiness and fatigue. °· Soreness and body aches. These side effects can affect parts of the body that were not involved in surgery. °Follow these instructions at home: ° °For at least 24 hours after the procedure: °· Have a responsible adult stay with you. It is important to have someone help care for you until you are awake and alert. °· Rest as needed. °· Do not: °? Participate in activities in which you could fall or become injured. °? Drive. °? Use heavy machinery. °? Drink alcohol. °? Take sleeping pills or medicines that cause drowsiness. °? Make important decisions or sign legal documents. °? Take care of children on your own. °Eating and drinking °· Follow any instructions from your health care provider about eating or drinking restrictions. °· When you feel hungry, start by eating small amounts of foods that are soft and easy to digest (bland), such as toast. Gradually return to your regular diet. °· Drink enough fluid to keep your urine pale yellow. °· If you vomit, rehydrate by drinking water, juice, or clear broth. °General instructions °· If you have sleep apnea, surgery and certain medicines can increase your risk for breathing problems. Follow instructions from your health care provider about wearing your sleep device: °? Anytime you are sleeping, including during daytime naps. °? While taking prescription pain medicines, sleeping medicines, or medicines that make you drowsy. °· Return to  your normal activities as told by your health care provider. Ask your health care provider what activities are safe for you. °· Take over-the-counter and prescription medicines only as told by your health care provider. °· If you smoke, do not smoke without supervision. °· Keep all follow-up visits as told by your health care provider. This is important. °Contact a health care provider if: °· You have nausea or vomiting that does not get better with medicine. °· You cannot eat or drink without vomiting. °· You have pain that does not get better with medicine. °· You are unable to pass urine. °· You develop a skin rash. °· You have a fever. °· You have redness around your IV site that gets worse. °Get help right away if: °· You have difficulty breathing. °· You have chest pain. °· You have blood in your urine or stool, or you vomit blood. °Summary °· After the procedure, it is common to have a sore throat or nausea. It is also common to feel tired. °· Have a responsible adult stay with you for the first 24 hours after general anesthesia. It is important to have someone help care for you until you are awake and alert. °· When you feel hungry, start by eating small amounts of foods that are soft and easy to digest (bland), such as toast. Gradually return to your regular diet. °· Drink enough fluid to keep your urine pale yellow. °· Return to your normal activities as told by your health care provider. Ask your health care provider what activities are safe for you. °This information is not   intended to replace advice given to you by your health care provider. Make sure you discuss any questions you have with your health care provider. °Document Released: 06/18/2000 Document Revised: 03/15/2017 Document Reviewed: 10/26/2016 °Elsevier Patient Education © 2020 [employer] °Cataract Surgery, Care After °This sheet gives you information about how to care for yourself after your procedure. Your health care provider may also  give you more specific instructions. If you have problems or questions, contact your health care provider. °What can I expect after the procedure? °After the procedure, it is common to have: °· Itching. °· Discomfort. °· Fluid discharge. °· Sensitivity to light and to touch. °· Bruising in or around the eye. °· Mild blurred vision. °Follow these instructions at home: °Eye care ° °· Do not touch or rub your eyes. °· Protect your eyes as told by your health care provider. You may be told to wear a protective eye shield or sunglasses. °· Do not put a contact lens into the affected eye or eyes until your health care provider approves. °· Keep the area around your eye clean and dry: °? Avoid swimming. °? Do not allow water to hit you directly in the face while showering. °? Keep soap and shampoo out of your eyes. °· Check your eye every day for signs of infection. Watch for: °? Redness, swelling, or pain. °? Fluid, blood, or pus. °? Warmth. °? A bad smell. °? Vision that is getting worse. °? Sensitivity that is getting worse. °Activity °· Do not drive for 24 hours if you were given a sedative during your procedure. °· Avoid strenuous activities, such as playing contact sports, for as long as told by your health care provider. °· Do not drive or use heavy machinery until your health care provider approves. °· Do not bend or lift heavy objects. Bending increases pressure in the eye. You can walk, climb stairs, and do light household chores. °· Ask your health care provider when you can return to work. If you work in a dusty environment, you may be advised to wear protective eyewear for a period of time. °General instructions °· Take or apply over-the-counter and prescription medicines only as told by your health care provider. This includes eye drops. °· Keep all follow-up visits as told by your health care provider. This is important. °Contact a health care provider if: °· You have increased bruising around your  eye. °· You have pain that is not helped with medicine. °· You have a fever. °· You have redness, swelling, or pain in your eye. °· You have fluid, blood, or pus coming from your incision. °· Your vision gets worse. °· Your sensitivity to light gets worse. °Get help right away if: °· You have sudden loss of vision. °· You see flashes of light or spots (floaters). °· You have severe eye pain. °· You develop nausea or vomiting. °Summary °· After your procedure, it is common to have itching, discomfort, bruising, fluid discharge, or sensitivity to light. °· Follow instructions from your health care provider about caring for your eye after the procedure. °· Do not rub your eye after the procedure. You may need to wear eye protection or sunglasses. Do not wear contact lenses. Keep the area around your eye clean and dry. °· Avoid activities that require a lot of effort. These include playing sports and lifting heavy objects. °· Contact a health care provider if you have increased bruising, pain that does not go away, or a fever. Get   help right away if you suddenly lose your vision, see flashes of light or spots, or have severe pain in the eye. °This information is not intended to replace advice given to you by your health care provider. Make sure you discuss any questions you have with your health care provider. °Document Released: 09/29/2004 Document Revised: 09/09/2017 Document Reviewed: 09/09/2017 °Elsevier Patient Education © 2020 Elsevier Inc. ° °

## 2018-11-28 ENCOUNTER — Other Ambulatory Visit: Payer: Self-pay

## 2018-11-28 ENCOUNTER — Other Ambulatory Visit
Admission: RE | Admit: 2018-11-28 | Discharge: 2018-11-28 | Disposition: A | Discharge status: Home / Self Care | Payer: PPO | Source: Ambulatory Visit | Attending: Ophthalmology | Admitting: Ophthalmology

## 2018-11-28 DIAGNOSIS — Z01812 Encounter for preprocedural laboratory examination: Secondary | ICD-10-CM | POA: Insufficient documentation

## 2018-11-28 DIAGNOSIS — Z20828 Contact with and (suspected) exposure to other viral communicable diseases: Secondary | ICD-10-CM | POA: Diagnosis not present

## 2018-11-28 LAB — SARS CORONAVIRUS 2 (TAT 6-24 HRS): SARS Coronavirus 2: NEGATIVE

## 2018-12-02 NOTE — Anesthesia Preprocedure Evaluation (Addendum)
Anesthesia Evaluation  Patient identified by MRN, date of birth, ID band Patient awake    Reviewed: Allergy & Precautions, NPO status , Patient's Chart, lab work & pertinent test results  History of Anesthesia Complications Negative for: history of anesthetic complications  Airway Mallampati: I   Neck ROM: Full    Dental  (+)    Pulmonary neg pulmonary ROS,    Pulmonary exam normal breath sounds clear to auscultation       Cardiovascular Exercise Tolerance: Good Normal cardiovascular exam+ Valvular Problems/Murmurs MVP  Rhythm:Regular Rate:Normal  Hx rheumatic fever   Neuro/Psych PSYCHIATRIC DISORDERS Depression negative neurological ROS     GI/Hepatic negative GI ROS,   Endo/Other  negative endocrine ROS  Renal/GU negative Renal ROS     Musculoskeletal   Abdominal   Peds  Hematology Skin CA   Anesthesia Other Findings   Reproductive/Obstetrics                            Anesthesia Physical Anesthesia Plan  ASA: II  Anesthesia Plan: MAC   Post-op Pain Management:    Induction: Intravenous  PONV Risk Score and Plan: 2 and TIVA and Midazolam  Airway Management Planned:   Additional Equipment:   Intra-op Plan:   Post-operative Plan:   Informed Consent: I have reviewed the patients History and Physical, chart, labs and discussed the procedure including the risks, benefits and alternatives for the proposed anesthesia with the patient or authorized representative who has indicated his/her understanding and acceptance.       Plan Discussed with: CRNA  Anesthesia Plan Comments:        Anesthesia Quick Evaluation

## 2018-12-03 ENCOUNTER — Encounter
Admission: RE | Disposition: A | Discharge status: Home / Self Care | Payer: Self-pay | Source: Home / Self Care | Attending: Ophthalmology

## 2018-12-03 ENCOUNTER — Ambulatory Visit: Payer: PPO | Admitting: Anesthesiology

## 2018-12-03 ENCOUNTER — Ambulatory Visit
Admission: RE | Admit: 2018-12-03 | Discharge: 2018-12-03 | Disposition: A | Discharge status: Home / Self Care | Payer: PPO | Attending: Ophthalmology | Admitting: Ophthalmology

## 2018-12-03 ENCOUNTER — Other Ambulatory Visit: Payer: Self-pay

## 2018-12-03 DIAGNOSIS — Z79899 Other long term (current) drug therapy: Secondary | ICD-10-CM | POA: Insufficient documentation

## 2018-12-03 DIAGNOSIS — E7800 Pure hypercholesterolemia, unspecified: Secondary | ICD-10-CM | POA: Diagnosis not present

## 2018-12-03 DIAGNOSIS — F419 Anxiety disorder, unspecified: Secondary | ICD-10-CM | POA: Diagnosis not present

## 2018-12-03 DIAGNOSIS — Z85828 Personal history of other malignant neoplasm of skin: Secondary | ICD-10-CM | POA: Diagnosis not present

## 2018-12-03 DIAGNOSIS — H2512 Age-related nuclear cataract, left eye: Secondary | ICD-10-CM | POA: Diagnosis not present

## 2018-12-03 DIAGNOSIS — H25812 Combined forms of age-related cataract, left eye: Secondary | ICD-10-CM | POA: Diagnosis not present

## 2018-12-03 HISTORY — PX: CATARACT EXTRACTION W/PHACO: SHX586

## 2018-12-03 SURGERY — PHACOEMULSIFICATION, CATARACT, WITH IOL INSERTION
Anesthesia: Monitor Anesthesia Care | Site: Eye | Laterality: Left

## 2018-12-03 MED ORDER — LIDOCAINE HCL (PF) 2 % IJ SOLN
INTRAOCULAR | Status: DC | PRN
  Administered 2018-12-03: 14:00:00 2 mL

## 2018-12-03 MED ORDER — NA HYALUR & NA CHOND-NA HYALUR 0.4-0.35 ML IO KIT
PACK | INTRAOCULAR | Status: DC | PRN
  Administered 2018-12-03: 1 mL via INTRAOCULAR

## 2018-12-03 MED ORDER — ARMC OPHTHALMIC DILATING DROPS
1.0000 "application " | OPHTHALMIC | Status: DC | PRN
  Administered 2018-12-03 (×3): 1 via OPHTHALMIC

## 2018-12-03 MED ORDER — MIDAZOLAM HCL 2 MG/2ML IJ SOLN
INTRAMUSCULAR | Status: DC | PRN
  Administered 2018-12-03 (×2): 1 mg via INTRAVENOUS

## 2018-12-03 MED ORDER — MOXIFLOXACIN HCL 0.5 % OP SOLN
1.0000 [drp] | OPHTHALMIC | Status: DC | PRN
  Administered 2018-12-03 (×3): 1 [drp] via OPHTHALMIC

## 2018-12-03 MED ORDER — BRIMONIDINE TARTRATE-TIMOLOL 0.2-0.5 % OP SOLN
OPHTHALMIC | Status: DC | PRN
  Administered 2018-12-03: 1 [drp] via OPHTHALMIC

## 2018-12-03 MED ORDER — TETRACAINE HCL 0.5 % OP SOLN
1.0000 [drp] | OPHTHALMIC | Status: DC | PRN
  Administered 2018-12-03 (×3): 1 [drp] via OPHTHALMIC

## 2018-12-03 MED ORDER — EPINEPHRINE PF 1 MG/ML IJ SOLN
INTRAOCULAR | Status: DC | PRN
  Administered 2018-12-03: 14:00:00 65 mL via OPHTHALMIC

## 2018-12-03 MED ORDER — CEFUROXIME OPHTHALMIC INJECTION 1 MG/0.1 ML
INJECTION | OPHTHALMIC | Status: DC | PRN
  Administered 2018-12-03: 0.1 mL via INTRACAMERAL

## 2018-12-03 MED ORDER — FENTANYL CITRATE (PF) 100 MCG/2ML IJ SOLN
INTRAMUSCULAR | Status: DC | PRN
  Administered 2018-12-03: 50 ug via INTRAVENOUS

## 2018-12-03 MED ORDER — ONDANSETRON HCL 4 MG/2ML IJ SOLN: 4.0000 mg | Freq: Once | INTRAMUSCULAR | Status: DC | PRN

## 2018-12-03 MED ORDER — ACETAMINOPHEN 325 MG PO TABS: 650.0000 mg | ORAL_TABLET | Freq: Once | ORAL | Status: DC | PRN

## 2018-12-03 MED ORDER — ACETAMINOPHEN 160 MG/5ML PO SOLN: 325.0000 mg | ORAL | Status: DC | PRN

## 2018-12-03 SURGICAL SUPPLY — 19 items
CANNULA ANT/CHMB 27G (MISCELLANEOUS) ×1 IMPLANT
CANNULA ANT/CHMB 27GA (MISCELLANEOUS) ×2 IMPLANT
GLOVE SURG LX 7.5 STRW (GLOVE) ×1
GLOVE SURG LX STRL 7.5 STRW (GLOVE) ×1 IMPLANT
GLOVE SURG TRIUMPH 8.0 PF LTX (GLOVE) ×2 IMPLANT
GOWN STRL REUS W/ TWL LRG LVL3 (GOWN DISPOSABLE) ×2 IMPLANT
GOWN STRL REUS W/TWL LRG LVL3 (GOWN DISPOSABLE) ×2
LENS IOL TECNIS ITEC 11.5 (Intraocular Lens) ×1 IMPLANT
MARKER SKIN DUAL TIP RULER LAB (MISCELLANEOUS) ×2 IMPLANT
NDL FILTER BLUNT 18X1 1/2 (NEEDLE) IMPLANT
NEEDLE FILTER BLUNT 18X 1/2SAF (NEEDLE) ×2
NEEDLE FILTER BLUNT 18X1 1/2 (NEEDLE) ×2 IMPLANT
PACK CATARACT BRASINGTON (MISCELLANEOUS) ×2 IMPLANT
PACK EYE AFTER SURG (MISCELLANEOUS) ×2 IMPLANT
PACK OPTHALMIC (MISCELLANEOUS) ×2 IMPLANT
SYR 3ML LL SCALE MARK (SYRINGE) ×3 IMPLANT
SYR TB 1ML LUER SLIP (SYRINGE) ×2 IMPLANT
WATER STERILE IRR 500ML POUR (IV SOLUTION) ×2 IMPLANT
WIPE NON LINTING 3.25X3.25 (MISCELLANEOUS) ×2 IMPLANT

## 2018-12-03 NOTE — Op Note (Signed)
OPERATIVE NOTE  Sabrina Burns [id number] 12/03/2018   PREOPERATIVE DIAGNOSIS:  Nuclear sclerotic cataract left eye. H25.12   POSTOPERATIVE DIAGNOSIS:    Nuclear sclerotic cataract left eye.     PROCEDURE:  Phacoemusification with posterior chamber intraocular lens placement of the left eye   LENS:   Implant Name Type Inv. Item Serial No. Manufacturer Lot No. LRB No. Used Action  LENS IOL DIOP 11.5 - [id number] Intraocular Lens LENS IOL DIOP 11.5 [id number]  Left 1 Implanted       ULTRASOUND TIME: 12  % of 0 minutes 57 seconds, CDE 7.1  SURGEON:  Wyonia Hough, MD   ANESTHESIA:  Topical with tetracaine drops and 2% Xylocaine jelly, augmented with 1% preservative-free intracameral lidocaine.    COMPLICATIONS:  None.   DESCRIPTION OF PROCEDURE:  The patient was identified in the holding room and transported to the operating room and placed in the supine position under the operating microscope.  The left eye was identified as the operative eye and it was prepped and draped in the usual sterile ophthalmic fashion.   A 1 millimeter clear-corneal paracentesis was made at the 1:30 position.  0.5 ml of preservative-free 1% lidocaine was injected into the anterior chamber.  The anterior chamber was filled with Viscoat viscoelastic.  A 2.4 millimeter keratome was used to make a near-clear corneal incision at the 10:30 position.  .  A curvilinear capsulorrhexis was made with a cystotome and capsulorrhexis forceps.  Balanced salt solution was used to hydrodissect and hydrodelineate the nucleus.   Phacoemulsification was then used in stop and chop fashion to remove the lens nucleus and epinucleus.  The remaining cortex was then removed using the irrigation and aspiration handpiece. Provisc was then placed into the capsular bag to distend it for lens placement.  A lens was then injected into the capsular bag.  The remaining viscoelastic was aspirated.   Wounds were hydrated with  balanced salt solution.  The anterior chamber was inflated to a physiologic pressure with balanced salt solution.  No wound leaks were noted. Cefuroxime 0.1 ml of a 10mg /ml solution was injected into the anterior chamber for a dose of 1 mg of intracameral antibiotic at the completion of the case.   Timolol and Brimonidine drops were applied to the eye.  The patient was taken to the recovery room in stable condition without complications of anesthesia or surgery.  [name] 12/03/2018, 1:49 PM

## 2018-12-03 NOTE — Anesthesia Postprocedure Evaluation (Signed)
Anesthesia Post Note  Patient: Sabrina Burns  Procedure(s) Performed: CATARACT EXTRACTION PHACO AND INTRAOCULAR LENS PLACEMENT ([place])  LEFT (Left Eye)  Patient location during evaluation: PACU Anesthesia Type: MAC Level of consciousness: awake and alert, oriented and patient cooperative Pain management: pain level controlled Vital Signs Assessment: post-procedure vital signs reviewed and stable Respiratory status: spontaneous breathing, nonlabored ventilation and respiratory function stable Cardiovascular status: blood pressure returned to baseline and stable Postop Assessment: adequate PO intake Anesthetic complications: no    Darrin Nipper

## 2018-12-03 NOTE — Anesthesia Procedure Notes (Signed)
Procedure Name: MAC Date/Time: 12/03/2018 1:33 PM Performed by: Georga Bora, CRNA Pre-anesthesia Checklist: Patient identified, Suction available, Emergency Drugs available, Patient being monitored and Timeout performed Patient Re-evaluated:Patient Re-evaluated prior to induction Oxygen Delivery Method: Nasal cannula

## 2018-12-03 NOTE — Transfer of Care (Signed)
Immediate Anesthesia Transfer of Care Note  Patient: Sabrina Burns  Procedure(s) Performed: CATARACT EXTRACTION PHACO AND INTRAOCULAR LENS PLACEMENT ([facility])  LEFT (Left Eye)  Patient Location: PACU  Anesthesia Type: MAC  Level of Consciousness: awake, alert  and patient cooperative  Airway and Oxygen Therapy: Patient Spontanous Breathing and Patient connected to supplemental oxygen  Post-op Assessment: Post-op Vital signs reviewed, Patient's Cardiovascular Status Stable, Respiratory Function Stable, Patent Airway and No signs of Nausea or vomiting  Post-op Vital Signs: Reviewed and stable  Complications: No apparent anesthesia complications

## 2018-12-03 NOTE — H&P (Signed)

## 2018-12-04 ENCOUNTER — Encounter: Payer: Self-pay | Admitting: Ophthalmology

## 2019-01-19 ENCOUNTER — Encounter: Payer: Self-pay | Admitting: Intensive Care

## 2019-01-19 ENCOUNTER — Emergency Department: Payer: PPO

## 2019-01-19 ENCOUNTER — Emergency Department
Admission: EM | Admit: 2019-01-19 | Discharge: 2019-01-19 | Disposition: A | Discharge status: Home / Self Care | Payer: PPO | Attending: Emergency Medicine | Admitting: Emergency Medicine

## 2019-01-19 ENCOUNTER — Other Ambulatory Visit: Payer: Self-pay

## 2019-01-19 DIAGNOSIS — Y92009 Unspecified place in unspecified non-institutional (private) residence as the place of occurrence of the external cause: Secondary | ICD-10-CM | POA: Diagnosis not present

## 2019-01-19 DIAGNOSIS — W19XXXA Unspecified fall, initial encounter: Secondary | ICD-10-CM

## 2019-01-19 DIAGNOSIS — Y999 Unspecified external cause status: Secondary | ICD-10-CM | POA: Diagnosis not present

## 2019-01-19 DIAGNOSIS — Y93K9 Activity, other involving animal care: Secondary | ICD-10-CM | POA: Insufficient documentation

## 2019-01-19 DIAGNOSIS — S4992XA Unspecified injury of left shoulder and upper arm, initial encounter: Secondary | ICD-10-CM | POA: Diagnosis not present

## 2019-01-19 DIAGNOSIS — Z85828 Personal history of other malignant neoplasm of skin: Secondary | ICD-10-CM | POA: Insufficient documentation

## 2019-01-19 DIAGNOSIS — Z79899 Other long term (current) drug therapy: Secondary | ICD-10-CM | POA: Insufficient documentation

## 2019-01-19 DIAGNOSIS — M25552 Pain in left hip: Secondary | ICD-10-CM | POA: Diagnosis not present

## 2019-01-19 DIAGNOSIS — W108XXA Fall (on) (from) other stairs and steps, initial encounter: Secondary | ICD-10-CM | POA: Diagnosis not present

## 2019-01-19 DIAGNOSIS — S7002XA Contusion of left hip, initial encounter: Secondary | ICD-10-CM | POA: Insufficient documentation

## 2019-01-19 DIAGNOSIS — S40012A Contusion of left shoulder, initial encounter: Secondary | ICD-10-CM | POA: Diagnosis not present

## 2019-01-19 DIAGNOSIS — S40912A Unspecified superficial injury of left shoulder, initial encounter: Secondary | ICD-10-CM | POA: Diagnosis present

## 2019-01-19 DIAGNOSIS — S79912A Unspecified injury of left hip, initial encounter: Secondary | ICD-10-CM | POA: Diagnosis not present

## 2019-01-19 DIAGNOSIS — M25512 Pain in left shoulder: Secondary | ICD-10-CM | POA: Diagnosis not present

## 2019-01-19 LAB — URINALYSIS, COMPLETE (UACMP) WITH MICROSCOPIC
Bacteria, UA: NONE SEEN
Bilirubin Urine: NEGATIVE
Glucose, UA: NEGATIVE mg/dL
Hgb urine dipstick: NEGATIVE
Ketones, ur: NEGATIVE mg/dL
Nitrite: NEGATIVE
Protein, ur: NEGATIVE mg/dL
Specific Gravity, Urine: 1.008 (ref 1.005–1.030)
pH: 8 (ref 5.0–8.0)

## 2019-01-19 LAB — COMPREHENSIVE METABOLIC PANEL
ALT: 20 U/L (ref 0–44)
AST: 23 U/L (ref 15–41)
Albumin: 4.5 g/dL (ref 3.5–5.0)
Alkaline Phosphatase: 57 U/L (ref 38–126)
Anion gap: 13 (ref 5–15)
BUN: 15 mg/dL (ref 8–23)
CO2: 22 mmol/L (ref 22–32)
Calcium: 9.4 mg/dL (ref 8.9–10.3)
Chloride: 107 mmol/L (ref 98–111)
Creatinine, Ser: 0.8 mg/dL (ref 0.44–1.00)
GFR calc Af Amer: >60 mL/min (ref >60)
GFR calc non Af Amer: >60 mL/min (ref >60)
Glucose, Bld: 120 mg/dL — ABNORMAL HIGH (ref 70–99)
Potassium: 3.6 mmol/L (ref 3.5–5.1)
Sodium: 142 mmol/L (ref 135–145)
Total Bilirubin: 1.7 mg/dL — ABNORMAL HIGH (ref 0.3–1.2)
Total Protein: 7 g/dL (ref 6.5–8.1)

## 2019-01-19 LAB — CBC WITH DIFFERENTIAL/PLATELET
Abs Immature Granulocytes: 0.03 10*3/uL (ref 0.00–0.07)
Basophils Absolute: 0 10*3/uL (ref 0.0–0.1)
Basophils Relative: 1 %
Eosinophils Absolute: 0.1 10*3/uL (ref 0.0–0.5)
Eosinophils Relative: 1 %
HCT: 41.2 % (ref 36.0–46.0)
Hemoglobin: 13.9 g/dL (ref 12.0–15.0)
Immature Granulocytes: 1 %
Lymphocytes Relative: 12 %
Lymphs Abs: 0.7 10*3/uL (ref 0.7–4.0)
MCH: 31 pg (ref 26.0–34.0)
MCHC: 33.7 g/dL (ref 30.0–36.0)
MCV: 91.8 fL (ref 80.0–100.0)
Monocytes Absolute: 0.4 10*3/uL (ref 0.1–1.0)
Monocytes Relative: 7 %
Neutro Abs: 4.5 10*3/uL (ref 1.7–7.7)
Neutrophils Relative %: 78 %
Platelets: 176 10*3/uL (ref 150–400)
RBC: 4.49 MIL/uL (ref 3.87–5.11)
RDW: 11.9 % (ref 11.5–15.5)
WBC: 5.7 10*3/uL (ref 4.0–10.5)
nRBC: 0 % (ref 0.0–0.2)

## 2019-01-19 MED ORDER — FENTANYL CITRATE (PF) 100 MCG/2ML IJ SOLN
12.5000 ug | Freq: Once | INTRAMUSCULAR | Status: AC
  Administered 2019-01-19: 12.5 ug via INTRAVENOUS
  Filled 2019-01-19: qty 2

## 2019-01-19 MED ORDER — OXYCODONE HCL 5 MG PO CAPS: ORAL_CAPSULE | ORAL | 0 refills | Status: DC

## 2019-01-19 MED ORDER — FENTANYL CITRATE (PF) 100 MCG/2ML IJ SOLN
25.0000 ug | Freq: Once | INTRAMUSCULAR | Status: AC
  Administered 2019-01-19: 25 ug via INTRAVENOUS
  Filled 2019-01-19: qty 2

## 2019-01-19 NOTE — ED Provider Notes (Signed)
[facility] Emergency Department Provider Note  ____________________________________________   First MD Initiated Contact with Patient 01/19/19 1021     (approximate)  I have reviewed the triage vital signs and the nursing notes.   HISTORY  Chief Complaint Arm Pain (left) and Groin Pain (left)   HPI Sabrina Burns is a 73 y.o. female presents to the ED after a fall at home at 8:30 AM.  Patient states that she was taking her to [facility] outside when they pulled her causing her to fall over some steps.  She states she landed on the cement with an injury to her left shoulder.  She then began having pain to her left groin area.  Patient is unable to bear weight.  Patient denies any head injury or loss of consciousness.  She states that she was brought to the ED by family members.  She rates her pain as a 10/10.       Past Medical History:  Diagnosis Date   Cancer ([place]) 02/2017   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   Complication of anesthesia    hypotension  with bladder surgery, hard to wake up   Depression    H/O: rheumatic fever    Hypercholesteremia unkn   Mitral valve prolapse    Osteoporosis     There are no active problems to display for this patient.   Past Surgical History:  Procedure Laterality Date   ABDOMINAL HYSTERECTOMY     BACK SURGERY     lumbar   CATARACT EXTRACTION W/PHACO Right 11/12/2018   Procedure: CATARACT EXTRACTION PHACO AND INTRAOCULAR LENS PLACEMENT ([place]) RIGHT;  Surgeon: Leandrew Koyanagi, MD;  Location: [place];  Service: Ophthalmology;  Laterality: Right;   CATARACT EXTRACTION W/PHACO Left 12/03/2018   Procedure: CATARACT EXTRACTION PHACO AND INTRAOCULAR LENS PLACEMENT ([place])  LEFT;  Surgeon: Leandrew Koyanagi, MD;  Location: [place];  Service: Ophthalmology;  Laterality: Left;  Total Time: 00:57 Total Equivalent Power: 12.3% Cumulative Dissipated Energy: 7.05   COLONOSCOPY      FOOT SURGERY Right    pelvic sling     SHOULDER ARTHROSCOPY WITH OPEN ROTATOR CUFF REPAIR Right 09/15/2015   Procedure: SHOULDER ARTHROSCOPY WITH OPEN ROTATOR CUFF REPAIR;  Surgeon: Corky Mull, MD;  Location: [facility];  Service: Orthopedics;  Laterality: Right;   SUTURE REMOVAL Left 07/03/2017   Procedure: REMOVAL OF RETAINED SUTURE FROM LEFT WRIST;  Surgeon: Corky Mull, MD;  Location: [place];  Service: Orthopedics;  Laterality: Left;   TONSILLECTOMY     TUBAL LIGATION     WRIST ARTHROSCOPY WITH FOVEAL TRIANGULAR FIBROCARTILAGE COMPLEX REPAIR Left 03/28/2017   Procedure: WRIST ARTHROSCOPY WITH DEBRIDEMENT AND REPAIR OF TFCC TEAR;  Surgeon: Corky Mull, MD;  Location: [facility];  Service: Orthopedics;  Laterality: Left;    Prior to Admission medications   Medication Sig Start Date End Date Taking? Authorizing Provider  citalopram (CELEXA) 20 MG tablet Take 20 mg by mouth daily. In am.    [provider]  naproxen sodium (ALEVE) 220 MG tablet Take 440 mg by mouth 2 (two) times daily as needed (for pain or headache).    [provider]  oxycodone (OXY-IR) 5 MG capsule 1-2 every 6 hours prn pain 01/19/19   Letitia Neri L, PA-C  simvastatin (ZOCOR) 20 MG tablet Take 20 mg by mouth daily at 6 PM.     [provider]    Allergies Patient has no known allergies.  Family History  Problem Relation Age of Onset   Breast cancer Maternal Aunt    Breast cancer Paternal Grandmother     Social History Social History   Tobacco Use   Smoking status: Never Smoker   Smokeless tobacco: Never Used  Substance Use Topics   Alcohol use: No   Drug use: No    Review of Systems Constitutional: No fever/chills Cardiovascular: Denies chest pain. Respiratory: Denies shortness of breath. Gastrointestinal: No abdominal pain.  Mild nausea, no vomiting.  Genitourinary: Negative for dysuria. Musculoskeletal: Positive for left shoulder pain, left hip  pain. Skin: Positive for abrasion. Neurological: Negative for headaches, focal weakness or numbness. ____________________________________________   PHYSICAL EXAM:  VITAL SIGNS: ED Triage Vitals  Enc Vitals Group     BP 01/19/19 1010 (!) 143/80     Pulse Rate 01/19/19 1010 63     Resp 01/19/19 1010 16     Temp 01/19/19 1010 97.7 F (36.5 C)     Temp Source 01/19/19 1010 Oral     SpO2 01/19/19 1010 100 %     Weight 01/19/19 1011 130 lb (59 kg)     Height 01/19/19 1011 5\' 1"  (1.549 m)     Head Circumference --      Peak Flow --      Pain Score 01/19/19 1011 10     Pain Loc --      Pain Edu? --      Excl. in [place]? --     Constitutional: Alert and oriented. Well appearing and in no acute distress. Eyes: Conjunctivae are normal. PERRL. EOMI. Head: Atraumatic. Nose: No congestion/rhinnorhea. Mouth/Throat: Mucous membranes are moist.  Oropharynx non-erythematous. Neck: No stridor.  No cervical tenderness on palpation posteriorly. Cardiovascular: Normal rate, regular rhythm. Grossly normal heart sounds.  Good peripheral circulation. Respiratory: Normal respiratory effort.  No retractions. Lungs CTAB. Gastrointestinal: Soft and nontender. No distention. Musculoskeletal: On examination of left shoulder there is moderate tenderness on palpation on the proximal anterior aspect.  Patient is unable to abduct her left arm due to pain.  There is no gross deformity.  Nontender palpation elbow and forearm.  There is some superficial abrasions to her left hand without active bleeding.  On examination of her pelvis there is moderate tenderness on palpation of the anterior lateral left hip.  No gross deformity or skin discoloration noted.  Patient has increased pain when trying to lift her left leg.  Patient is nontender on palpation of her left patella or tib-fib.  No injury or tenderness is noted to her foot.  Patient is able to move her right upper and lower extremities without any pain. Neurologic:   Normal speech and language. No gross focal neurologic deficits are appreciated. No gait instability. Skin:  Skin is warm, dry and intact. No rash noted. Psychiatric: Mood and affect are normal. Speech and behavior are normal.  ____________________________________________   LABS (all labs ordered are listed, but only abnormal results are displayed)  Labs Reviewed  COMPREHENSIVE METABOLIC PANEL - Abnormal; Notable for the following components:      Result Value   Glucose, Bld 120 (*)    Total Bilirubin 1.7 (*)    All other components within normal limits  URINALYSIS, COMPLETE (UACMP) WITH MICROSCOPIC - Abnormal; Notable for the following components:   Color, Urine STRAW (*)    APPearance CLEAR (*)    Leukocytes,Ua TRACE (*)    All other components within normal limits  CBC WITH DIFFERENTIAL/PLATELET  RADIOLOGY  Official radiology report(s): Dg Shoulder Left  Result Date: 01/19/2019 CLINICAL DATA:  Fall this morning with left shoulder pain. EXAM: LEFT SHOULDER - 2+ VIEW COMPARISON:  None. FINDINGS: Mild degenerative change of the glenohumeral joint. No evidence of acute fracture or dislocation. IMPRESSION: No acute fracture or dislocation. Electronically Signed   By: Marin Olp M.D.   On: 01/19/2019 11:46   Dg Hip Unilat W Or Wo Pelvis 2-3 Views Left  Result Date: 01/19/2019 CLINICAL DATA:  Fall this morning with hip pain.  Initial encounter. EXAM: DG HIP (WITH OR WITHOUT PELVIS) 2-3V LEFT COMPARISON:  05/29/2018 FINDINGS: There is no evidence of hip fracture or dislocation. There is no evidence of arthropathy or other focal bone abnormality. IMPRESSION: No evidence of fracture. Electronically Signed   By: Monte Fantasia M.D.   On: 01/19/2019 11:47    ____________________________________________   PROCEDURES  Procedure(s) performed (including Critical Care):  Procedures   ____________________________________________   INITIAL IMPRESSION / ASSESSMENT AND PLAN / ED  COURSE  As part of my medical decision making, I reviewed the following data within the electronic MEDICAL RECORD NUMBER Notes from prior ED visits and [place] Controlled Substance Database  73 year old female presents to the ED via family members after a fall that occurred at 57.  This was a mechanical fall that occurred when her to [facility] pulled her causing her to trip and fall forward.  Patient denied any head injury or loss of consciousness.  Her complaints are on her left side only with left shoulder and left hip area.  X-rays were reviewed and this provider did not see any fractures.  Radiologist also reviewed x-rays with no fracture seen.  Patient was reassured.  She initially was given fentanyl 12.5 mg with some relief of her pain.  Prior to discharge she was given a another dose of fentanyl 25 mg.  In the past she has taken Percocet when she had rotator cuff surgery 3 years ago.  She states that she did not have any difficulties taking this medication.  She was discharged with a prescription.  She is encouraged to use ice to these areas.  She will return to the emergency department if any severe worsening of her symptoms or not improving.  ____________________________________________   FINAL CLINICAL IMPRESSION(S) / ED DIAGNOSES  Final diagnoses:  Contusion of left hip, initial encounter  Contusion of left shoulder, initial encounter  Fall at home, initial encounter     ED Discharge Orders         Ordered    oxycodone (OXY-IR) 5 MG capsule     01/19/19 1208           Note:  This document was prepared using Dragon voice recognition software and may include unintentional dictation errors.    Johnn Hai, PA-C 01/19/19 1448    Delman Kitten, MD 01/19/19 [phone]

## 2019-01-19 NOTE — ED Triage Notes (Signed)
Her dogs pulled her down stairs.  Says she missed the stairs completely and fell onto cement on her left side.  She did not hit her head, but she feels nauseated, so they could not see her at [facility].

## 2019-01-19 NOTE — ED Notes (Signed)
See triage note  Presents s/p fall  States she was taking her's daughters dogs out  They pulled away from her  She flipped over steps  Landed on left side on cement  Having pain to left shoulder,abrasion to left elbow and pain to left groin area  Unable to bear wt

## 2019-01-19 NOTE — ED Notes (Signed)
Wound on left forearm cleaned and bandage placed over

## 2019-01-19 NOTE — Discharge Instructions (Signed)
Follow-up with your primary care provider if any continued problems or need for continued pain medication.  You may use ice to your hip and shoulder as needed for discomfort.  The pain medication was sent to your pharmacy.  This is the same medications that you were given when you had rotator cuff surgery.  Be aware that this medication could cause drowsiness.  You can expect to be more sore tomorrow than you are currently.  If any severe worsening or urgent concerns return to the emergency department.

## 2019-03-04 DIAGNOSIS — E785 Hyperlipidemia, unspecified: Secondary | ICD-10-CM | POA: Diagnosis not present

## 2019-03-04 DIAGNOSIS — Z5181 Encounter for therapeutic drug level monitoring: Secondary | ICD-10-CM | POA: Diagnosis not present

## 2019-03-04 DIAGNOSIS — Z0001 Encounter for general adult medical examination with abnormal findings: Secondary | ICD-10-CM | POA: Diagnosis not present

## 2019-03-11 DIAGNOSIS — Z0000 Encounter for general adult medical examination without abnormal findings: Secondary | ICD-10-CM | POA: Diagnosis not present

## 2019-03-11 DIAGNOSIS — Z0001 Encounter for general adult medical examination with abnormal findings: Secondary | ICD-10-CM | POA: Diagnosis not present

## 2019-03-11 DIAGNOSIS — E785 Hyperlipidemia, unspecified: Secondary | ICD-10-CM | POA: Diagnosis not present

## 2019-03-11 DIAGNOSIS — F329 Major depressive disorder, single episode, unspecified: Secondary | ICD-10-CM | POA: Diagnosis not present

## 2019-03-12 ENCOUNTER — Other Ambulatory Visit: Payer: Self-pay | Admitting: Internal Medicine

## 2019-03-12 DIAGNOSIS — Z1231 Encounter for screening mammogram for malignant neoplasm of breast: Secondary | ICD-10-CM

## 2019-03-18 ENCOUNTER — Ambulatory Visit
Admission: RE | Admit: 2019-03-18 | Discharge: 2019-03-18 | Disposition: A | Discharge status: Home / Self Care | Payer: PPO | Source: Ambulatory Visit | Attending: Internal Medicine | Admitting: Internal Medicine

## 2019-03-18 DIAGNOSIS — Z1231 Encounter for screening mammogram for malignant neoplasm of breast: Secondary | ICD-10-CM | POA: Diagnosis not present

## 2019-03-31 DIAGNOSIS — I872 Venous insufficiency (chronic) (peripheral): Secondary | ICD-10-CM | POA: Diagnosis not present

## 2019-03-31 DIAGNOSIS — L570 Actinic keratosis: Secondary | ICD-10-CM | POA: Diagnosis not present

## 2019-03-31 DIAGNOSIS — D2262 Melanocytic nevi of left upper limb, including shoulder: Secondary | ICD-10-CM | POA: Diagnosis not present

## 2019-03-31 DIAGNOSIS — L249 Irritant contact dermatitis, unspecified cause: Secondary | ICD-10-CM | POA: Diagnosis not present

## 2019-03-31 DIAGNOSIS — Z85828 Personal history of other malignant neoplasm of skin: Secondary | ICD-10-CM | POA: Diagnosis not present

## 2019-03-31 DIAGNOSIS — D2261 Melanocytic nevi of right upper limb, including shoulder: Secondary | ICD-10-CM | POA: Diagnosis not present

## 2019-03-31 DIAGNOSIS — D2271 Melanocytic nevi of right lower limb, including hip: Secondary | ICD-10-CM | POA: Diagnosis not present

## 2019-03-31 DIAGNOSIS — X32XXXA Exposure to sunlight, initial encounter: Secondary | ICD-10-CM | POA: Diagnosis not present

## 2019-07-02 DIAGNOSIS — H40053 Ocular hypertension, bilateral: Secondary | ICD-10-CM | POA: Diagnosis not present

## 2019-07-07 DIAGNOSIS — H40053 Ocular hypertension, bilateral: Secondary | ICD-10-CM | POA: Diagnosis not present

## 2019-07-07 DIAGNOSIS — H04123 Dry eye syndrome of bilateral lacrimal glands: Secondary | ICD-10-CM | POA: Diagnosis not present

## 2019-08-19 DIAGNOSIS — L570 Actinic keratosis: Secondary | ICD-10-CM | POA: Diagnosis not present

## 2019-08-19 DIAGNOSIS — L820 Inflamed seborrheic keratosis: Secondary | ICD-10-CM | POA: Diagnosis not present

## 2019-08-19 DIAGNOSIS — L298 Other pruritus: Secondary | ICD-10-CM | POA: Diagnosis not present

## 2019-08-19 DIAGNOSIS — D485 Neoplasm of uncertain behavior of skin: Secondary | ICD-10-CM | POA: Diagnosis not present

## 2019-09-02 DIAGNOSIS — X32XXXA Exposure to sunlight, initial encounter: Secondary | ICD-10-CM | POA: Diagnosis not present

## 2019-09-02 DIAGNOSIS — L570 Actinic keratosis: Secondary | ICD-10-CM | POA: Diagnosis not present

## 2019-12-23 DIAGNOSIS — S43101A Unspecified dislocation of right acromioclavicular joint, initial encounter: Secondary | ICD-10-CM | POA: Diagnosis not present

## 2019-12-23 DIAGNOSIS — M25511 Pain in right shoulder: Secondary | ICD-10-CM | POA: Diagnosis not present

## 2020-01-04 DIAGNOSIS — H40053 Ocular hypertension, bilateral: Secondary | ICD-10-CM | POA: Diagnosis not present

## 2020-01-09 DIAGNOSIS — S22078A Other fracture of T9-T10 vertebra, initial encounter for closed fracture: Secondary | ICD-10-CM | POA: Diagnosis not present

## 2020-01-09 DIAGNOSIS — M47816 Spondylosis without myelopathy or radiculopathy, lumbar region: Secondary | ICD-10-CM | POA: Diagnosis not present

## 2020-01-09 DIAGNOSIS — M549 Dorsalgia, unspecified: Secondary | ICD-10-CM | POA: Diagnosis not present

## 2020-01-09 DIAGNOSIS — R918 Other nonspecific abnormal finding of lung field: Secondary | ICD-10-CM | POA: Diagnosis not present

## 2020-01-09 DIAGNOSIS — S22009A Unspecified fracture of unspecified thoracic vertebra, initial encounter for closed fracture: Secondary | ICD-10-CM | POA: Diagnosis not present

## 2020-01-09 DIAGNOSIS — S22009S Unspecified fracture of unspecified thoracic vertebra, sequela: Secondary | ICD-10-CM | POA: Diagnosis not present

## 2020-01-09 DIAGNOSIS — S2232XA Fracture of one rib, left side, initial encounter for closed fracture: Secondary | ICD-10-CM | POA: Diagnosis not present

## 2020-01-09 DIAGNOSIS — Z23 Encounter for immunization: Secondary | ICD-10-CM | POA: Diagnosis not present

## 2020-01-09 DIAGNOSIS — S2242XS Multiple fractures of ribs, left side, sequela: Secondary | ICD-10-CM | POA: Diagnosis not present

## 2020-01-09 DIAGNOSIS — J90 Pleural effusion, not elsewhere classified: Secondary | ICD-10-CM | POA: Diagnosis not present

## 2020-01-09 DIAGNOSIS — D62 Acute posthemorrhagic anemia: Secondary | ICD-10-CM | POA: Diagnosis not present

## 2020-01-09 DIAGNOSIS — S270XXA Traumatic pneumothorax, initial encounter: Secondary | ICD-10-CM | POA: Diagnosis not present

## 2020-01-09 DIAGNOSIS — S129XXA Fracture of neck, unspecified, initial encounter: Secondary | ICD-10-CM | POA: Diagnosis not present

## 2020-01-09 DIAGNOSIS — R531 Weakness: Secondary | ICD-10-CM | POA: Diagnosis not present

## 2020-01-09 DIAGNOSIS — E041 Nontoxic single thyroid nodule: Secondary | ICD-10-CM | POA: Diagnosis not present

## 2020-01-09 DIAGNOSIS — Z043 Encounter for examination and observation following other accident: Secondary | ICD-10-CM | POA: Diagnosis not present

## 2020-01-09 DIAGNOSIS — G8911 Acute pain due to trauma: Secondary | ICD-10-CM | POA: Diagnosis not present

## 2020-01-09 DIAGNOSIS — S22079A Unspecified fracture of T9-T10 vertebra, initial encounter for closed fracture: Secondary | ICD-10-CM | POA: Diagnosis not present

## 2020-01-09 DIAGNOSIS — S22059A Unspecified fracture of T5-T6 vertebra, initial encounter for closed fracture: Secondary | ICD-10-CM | POA: Diagnosis not present

## 2020-01-09 DIAGNOSIS — S272XXA Traumatic hemopneumothorax, initial encounter: Secondary | ICD-10-CM | POA: Diagnosis not present

## 2020-01-09 DIAGNOSIS — Z20822 Contact with and (suspected) exposure to covid-19: Secondary | ICD-10-CM | POA: Diagnosis not present

## 2020-01-09 DIAGNOSIS — M5030 Other cervical disc degeneration, unspecified cervical region: Secondary | ICD-10-CM | POA: Diagnosis not present

## 2020-01-09 DIAGNOSIS — K449 Diaphragmatic hernia without obstruction or gangrene: Secondary | ICD-10-CM | POA: Diagnosis not present

## 2020-01-09 DIAGNOSIS — Z7409 Other reduced mobility: Secondary | ICD-10-CM | POA: Diagnosis not present

## 2020-01-09 DIAGNOSIS — S22058A Other fracture of T5-T6 vertebra, initial encounter for closed fracture: Secondary | ICD-10-CM | POA: Diagnosis not present

## 2020-01-09 DIAGNOSIS — J942 Hemothorax: Secondary | ICD-10-CM | POA: Diagnosis not present

## 2020-01-09 DIAGNOSIS — S12000A Unspecified displaced fracture of first cervical vertebra, initial encounter for closed fracture: Secondary | ICD-10-CM | POA: Diagnosis not present

## 2020-01-09 DIAGNOSIS — M4316 Spondylolisthesis, lumbar region: Secondary | ICD-10-CM | POA: Diagnosis not present

## 2020-01-09 DIAGNOSIS — M1909 Primary osteoarthritis, other specified site: Secondary | ICD-10-CM | POA: Diagnosis not present

## 2020-01-09 DIAGNOSIS — R109 Unspecified abdominal pain: Secondary | ICD-10-CM | POA: Diagnosis not present

## 2020-01-09 DIAGNOSIS — K429 Umbilical hernia without obstruction or gangrene: Secondary | ICD-10-CM | POA: Diagnosis not present

## 2020-01-09 DIAGNOSIS — S22069A Unspecified fracture of T7-T8 vertebra, initial encounter for closed fracture: Secondary | ICD-10-CM | POA: Diagnosis not present

## 2020-01-09 DIAGNOSIS — S2242XA Multiple fractures of ribs, left side, initial encounter for closed fracture: Secondary | ICD-10-CM | POA: Diagnosis not present

## 2020-01-09 DIAGNOSIS — K5730 Diverticulosis of large intestine without perforation or abscess without bleeding: Secondary | ICD-10-CM | POA: Diagnosis not present

## 2020-01-09 DIAGNOSIS — Z7901 Long term (current) use of anticoagulants: Secondary | ICD-10-CM | POA: Diagnosis not present

## 2020-01-09 DIAGNOSIS — J939 Pneumothorax, unspecified: Secondary | ICD-10-CM | POA: Diagnosis not present

## 2020-01-09 DIAGNOSIS — S199XXA Unspecified injury of neck, initial encounter: Secondary | ICD-10-CM | POA: Diagnosis not present

## 2020-01-09 DIAGNOSIS — M25512 Pain in left shoulder: Secondary | ICD-10-CM | POA: Diagnosis not present

## 2020-01-09 DIAGNOSIS — Z9189 Other specified personal risk factors, not elsewhere classified: Secondary | ICD-10-CM | POA: Diagnosis not present

## 2020-01-09 DIAGNOSIS — S22068A Other fracture of T7-T8 thoracic vertebra, initial encounter for closed fracture: Secondary | ICD-10-CM | POA: Diagnosis not present

## 2020-01-10 DIAGNOSIS — S22068A Other fracture of T7-T8 thoracic vertebra, initial encounter for closed fracture: Secondary | ICD-10-CM | POA: Diagnosis not present

## 2020-01-10 DIAGNOSIS — S22078A Other fracture of T9-T10 vertebra, initial encounter for closed fracture: Secondary | ICD-10-CM | POA: Diagnosis not present

## 2020-01-10 DIAGNOSIS — S270XXA Traumatic pneumothorax, initial encounter: Secondary | ICD-10-CM | POA: Diagnosis not present

## 2020-01-10 DIAGNOSIS — S22058A Other fracture of T5-T6 vertebra, initial encounter for closed fracture: Secondary | ICD-10-CM | POA: Diagnosis not present

## 2020-01-10 DIAGNOSIS — S2242XA Multiple fractures of ribs, left side, initial encounter for closed fracture: Secondary | ICD-10-CM | POA: Diagnosis not present

## 2020-01-10 DIAGNOSIS — D62 Acute posthemorrhagic anemia: Secondary | ICD-10-CM | POA: Diagnosis not present

## 2020-01-10 DIAGNOSIS — M25512 Pain in left shoulder: Secondary | ICD-10-CM | POA: Diagnosis not present

## 2020-01-11 DIAGNOSIS — S2242XA Multiple fractures of ribs, left side, initial encounter for closed fracture: Secondary | ICD-10-CM | POA: Diagnosis not present

## 2020-01-11 DIAGNOSIS — J939 Pneumothorax, unspecified: Secondary | ICD-10-CM | POA: Diagnosis not present

## 2020-01-11 DIAGNOSIS — R918 Other nonspecific abnormal finding of lung field: Secondary | ICD-10-CM | POA: Diagnosis not present

## 2020-01-11 DIAGNOSIS — S22079A Unspecified fracture of T9-T10 vertebra, initial encounter for closed fracture: Secondary | ICD-10-CM | POA: Diagnosis not present

## 2020-01-11 DIAGNOSIS — J90 Pleural effusion, not elsewhere classified: Secondary | ICD-10-CM | POA: Diagnosis not present

## 2020-01-14 DIAGNOSIS — S22009S Unspecified fracture of unspecified thoracic vertebra, sequela: Secondary | ICD-10-CM | POA: Diagnosis not present

## 2020-01-14 DIAGNOSIS — Z9189 Other specified personal risk factors, not elsewhere classified: Secondary | ICD-10-CM | POA: Diagnosis not present

## 2020-01-14 DIAGNOSIS — S2242XS Multiple fractures of ribs, left side, sequela: Secondary | ICD-10-CM | POA: Diagnosis not present

## 2020-01-14 DIAGNOSIS — Z7409 Other reduced mobility: Secondary | ICD-10-CM | POA: Diagnosis not present

## 2020-01-14 DIAGNOSIS — Z7901 Long term (current) use of anticoagulants: Secondary | ICD-10-CM | POA: Diagnosis not present

## 2020-01-14 DIAGNOSIS — G8911 Acute pain due to trauma: Secondary | ICD-10-CM | POA: Diagnosis not present

## 2020-01-15 DIAGNOSIS — S22009S Unspecified fracture of unspecified thoracic vertebra, sequela: Secondary | ICD-10-CM | POA: Diagnosis not present

## 2020-01-15 DIAGNOSIS — G8911 Acute pain due to trauma: Secondary | ICD-10-CM | POA: Diagnosis not present

## 2020-01-15 DIAGNOSIS — Z7901 Long term (current) use of anticoagulants: Secondary | ICD-10-CM | POA: Diagnosis not present

## 2020-01-15 DIAGNOSIS — Z9189 Other specified personal risk factors, not elsewhere classified: Secondary | ICD-10-CM | POA: Diagnosis not present

## 2020-01-15 DIAGNOSIS — Z7409 Other reduced mobility: Secondary | ICD-10-CM | POA: Diagnosis not present

## 2020-01-15 DIAGNOSIS — S2242XS Multiple fractures of ribs, left side, sequela: Secondary | ICD-10-CM | POA: Diagnosis not present

## 2020-01-19 DIAGNOSIS — S2242XA Multiple fractures of ribs, left side, initial encounter for closed fracture: Secondary | ICD-10-CM | POA: Diagnosis not present

## 2020-01-19 DIAGNOSIS — Z9189 Other specified personal risk factors, not elsewhere classified: Secondary | ICD-10-CM | POA: Diagnosis not present

## 2020-01-19 DIAGNOSIS — G8911 Acute pain due to trauma: Secondary | ICD-10-CM | POA: Diagnosis not present

## 2020-01-19 DIAGNOSIS — S22009A Unspecified fracture of unspecified thoracic vertebra, initial encounter for closed fracture: Secondary | ICD-10-CM | POA: Diagnosis not present

## 2020-01-19 DIAGNOSIS — Z7901 Long term (current) use of anticoagulants: Secondary | ICD-10-CM | POA: Diagnosis not present

## 2020-01-19 DIAGNOSIS — R531 Weakness: Secondary | ICD-10-CM | POA: Diagnosis not present

## 2020-01-19 DIAGNOSIS — Z7409 Other reduced mobility: Secondary | ICD-10-CM | POA: Diagnosis not present

## 2020-01-22 DIAGNOSIS — E785 Hyperlipidemia, unspecified: Secondary | ICD-10-CM | POA: Diagnosis not present

## 2020-01-22 DIAGNOSIS — S22079D Unspecified fracture of T9-T10 vertebra, subsequent encounter for fracture with routine healing: Secondary | ICD-10-CM | POA: Diagnosis not present

## 2020-01-22 DIAGNOSIS — S2242XD Multiple fractures of ribs, left side, subsequent encounter for fracture with routine healing: Secondary | ICD-10-CM | POA: Diagnosis not present

## 2020-01-22 DIAGNOSIS — S22059D Unspecified fracture of T5-T6 vertebra, subsequent encounter for fracture with routine healing: Secondary | ICD-10-CM | POA: Diagnosis not present

## 2020-01-22 DIAGNOSIS — F32A Depression, unspecified: Secondary | ICD-10-CM | POA: Diagnosis not present

## 2020-01-22 DIAGNOSIS — W19XXXD Unspecified fall, subsequent encounter: Secondary | ICD-10-CM | POA: Diagnosis not present

## 2020-01-22 DIAGNOSIS — S22069D Unspecified fracture of T7-T8 vertebra, subsequent encounter for fracture with routine healing: Secondary | ICD-10-CM | POA: Diagnosis not present

## 2020-01-22 DIAGNOSIS — J942 Hemothorax: Secondary | ICD-10-CM | POA: Diagnosis not present

## 2020-01-22 DIAGNOSIS — K5900 Constipation, unspecified: Secondary | ICD-10-CM | POA: Diagnosis not present

## 2020-01-22 DIAGNOSIS — J90 Pleural effusion, not elsewhere classified: Secondary | ICD-10-CM | POA: Diagnosis not present

## 2020-01-25 DIAGNOSIS — S22059D Unspecified fracture of T5-T6 vertebra, subsequent encounter for fracture with routine healing: Secondary | ICD-10-CM | POA: Diagnosis not present

## 2020-01-25 DIAGNOSIS — S22079D Unspecified fracture of T9-T10 vertebra, subsequent encounter for fracture with routine healing: Secondary | ICD-10-CM | POA: Diagnosis not present

## 2020-01-25 DIAGNOSIS — F32A Depression, unspecified: Secondary | ICD-10-CM | POA: Diagnosis not present

## 2020-01-25 DIAGNOSIS — K5900 Constipation, unspecified: Secondary | ICD-10-CM | POA: Diagnosis not present

## 2020-01-25 DIAGNOSIS — J942 Hemothorax: Secondary | ICD-10-CM | POA: Diagnosis not present

## 2020-01-25 DIAGNOSIS — E785 Hyperlipidemia, unspecified: Secondary | ICD-10-CM | POA: Diagnosis not present

## 2020-01-25 DIAGNOSIS — S22069D Unspecified fracture of T7-T8 vertebra, subsequent encounter for fracture with routine healing: Secondary | ICD-10-CM | POA: Diagnosis not present

## 2020-01-25 DIAGNOSIS — S2242XD Multiple fractures of ribs, left side, subsequent encounter for fracture with routine healing: Secondary | ICD-10-CM | POA: Diagnosis not present

## 2020-01-25 DIAGNOSIS — J90 Pleural effusion, not elsewhere classified: Secondary | ICD-10-CM | POA: Diagnosis not present

## 2020-01-25 DIAGNOSIS — W19XXXD Unspecified fall, subsequent encounter: Secondary | ICD-10-CM | POA: Diagnosis not present

## 2020-01-27 DIAGNOSIS — E785 Hyperlipidemia, unspecified: Secondary | ICD-10-CM | POA: Diagnosis not present

## 2020-01-27 DIAGNOSIS — E041 Nontoxic single thyroid nodule: Secondary | ICD-10-CM | POA: Diagnosis not present

## 2020-01-27 DIAGNOSIS — F32A Depression, unspecified: Secondary | ICD-10-CM | POA: Diagnosis not present

## 2020-01-27 DIAGNOSIS — S22009D Unspecified fracture of unspecified thoracic vertebra, subsequent encounter for fracture with routine healing: Secondary | ICD-10-CM | POA: Diagnosis not present

## 2020-01-27 DIAGNOSIS — S2243XD Multiple fractures of ribs, bilateral, subsequent encounter for fracture with routine healing: Secondary | ICD-10-CM | POA: Diagnosis not present

## 2020-02-02 DIAGNOSIS — S32009A Unspecified fracture of unspecified lumbar vertebra, initial encounter for closed fracture: Secondary | ICD-10-CM | POA: Diagnosis not present

## 2020-02-02 DIAGNOSIS — S22059D Unspecified fracture of T5-T6 vertebra, subsequent encounter for fracture with routine healing: Secondary | ICD-10-CM | POA: Diagnosis not present

## 2020-02-02 DIAGNOSIS — I341 Nonrheumatic mitral (valve) prolapse: Secondary | ICD-10-CM | POA: Diagnosis not present

## 2020-02-02 DIAGNOSIS — Z79891 Long term (current) use of opiate analgesic: Secondary | ICD-10-CM | POA: Diagnosis not present

## 2020-02-02 DIAGNOSIS — M4316 Spondylolisthesis, lumbar region: Secondary | ICD-10-CM | POA: Diagnosis not present

## 2020-02-02 DIAGNOSIS — M47816 Spondylosis without myelopathy or radiculopathy, lumbar region: Secondary | ICD-10-CM | POA: Diagnosis not present

## 2020-02-02 DIAGNOSIS — M5136 Other intervertebral disc degeneration, lumbar region: Secondary | ICD-10-CM | POA: Diagnosis not present

## 2020-02-02 DIAGNOSIS — S2242XD Multiple fractures of ribs, left side, subsequent encounter for fracture with routine healing: Secondary | ICD-10-CM | POA: Diagnosis not present

## 2020-02-02 DIAGNOSIS — M5416 Radiculopathy, lumbar region: Secondary | ICD-10-CM | POA: Diagnosis not present

## 2020-02-02 DIAGNOSIS — M4726 Other spondylosis with radiculopathy, lumbar region: Secondary | ICD-10-CM | POA: Diagnosis not present

## 2020-02-02 DIAGNOSIS — M1990 Unspecified osteoarthritis, unspecified site: Secondary | ICD-10-CM | POA: Diagnosis not present

## 2020-02-04 DIAGNOSIS — J942 Hemothorax: Secondary | ICD-10-CM | POA: Diagnosis not present

## 2020-02-04 DIAGNOSIS — S2242XD Multiple fractures of ribs, left side, subsequent encounter for fracture with routine healing: Secondary | ICD-10-CM | POA: Diagnosis not present

## 2020-02-04 DIAGNOSIS — S22079D Unspecified fracture of T9-T10 vertebra, subsequent encounter for fracture with routine healing: Secondary | ICD-10-CM | POA: Diagnosis not present

## 2020-02-04 DIAGNOSIS — W19XXXD Unspecified fall, subsequent encounter: Secondary | ICD-10-CM | POA: Diagnosis not present

## 2020-02-04 DIAGNOSIS — J90 Pleural effusion, not elsewhere classified: Secondary | ICD-10-CM | POA: Diagnosis not present

## 2020-02-04 DIAGNOSIS — S22069D Unspecified fracture of T7-T8 vertebra, subsequent encounter for fracture with routine healing: Secondary | ICD-10-CM | POA: Diagnosis not present

## 2020-02-04 DIAGNOSIS — S22059D Unspecified fracture of T5-T6 vertebra, subsequent encounter for fracture with routine healing: Secondary | ICD-10-CM | POA: Diagnosis not present

## 2020-02-25 DIAGNOSIS — S2242XD Multiple fractures of ribs, left side, subsequent encounter for fracture with routine healing: Secondary | ICD-10-CM | POA: Diagnosis not present

## 2020-02-25 DIAGNOSIS — F32A Depression, unspecified: Secondary | ICD-10-CM | POA: Diagnosis not present

## 2020-02-25 DIAGNOSIS — J90 Pleural effusion, not elsewhere classified: Secondary | ICD-10-CM | POA: Diagnosis not present

## 2020-02-25 DIAGNOSIS — W19XXXD Unspecified fall, subsequent encounter: Secondary | ICD-10-CM | POA: Diagnosis not present

## 2020-02-25 DIAGNOSIS — S22059D Unspecified fracture of T5-T6 vertebra, subsequent encounter for fracture with routine healing: Secondary | ICD-10-CM | POA: Diagnosis not present

## 2020-02-25 DIAGNOSIS — E785 Hyperlipidemia, unspecified: Secondary | ICD-10-CM | POA: Diagnosis not present

## 2020-02-25 DIAGNOSIS — S22069D Unspecified fracture of T7-T8 vertebra, subsequent encounter for fracture with routine healing: Secondary | ICD-10-CM | POA: Diagnosis not present

## 2020-02-25 DIAGNOSIS — K5900 Constipation, unspecified: Secondary | ICD-10-CM | POA: Diagnosis not present

## 2020-02-25 DIAGNOSIS — J942 Hemothorax: Secondary | ICD-10-CM | POA: Diagnosis not present

## 2020-02-25 DIAGNOSIS — S22079D Unspecified fracture of T9-T10 vertebra, subsequent encounter for fracture with routine healing: Secondary | ICD-10-CM | POA: Diagnosis not present

## 2020-03-04 DIAGNOSIS — Z0001 Encounter for general adult medical examination with abnormal findings: Secondary | ICD-10-CM | POA: Diagnosis not present

## 2020-03-04 DIAGNOSIS — E785 Hyperlipidemia, unspecified: Secondary | ICD-10-CM | POA: Diagnosis not present

## 2020-03-21 ENCOUNTER — Other Ambulatory Visit: Payer: Self-pay | Admitting: Infectious Diseases

## 2020-03-21 DIAGNOSIS — F32A Depression, unspecified: Secondary | ICD-10-CM | POA: Diagnosis not present

## 2020-03-21 DIAGNOSIS — E041 Nontoxic single thyroid nodule: Secondary | ICD-10-CM | POA: Diagnosis not present

## 2020-03-21 DIAGNOSIS — E785 Hyperlipidemia, unspecified: Secondary | ICD-10-CM | POA: Diagnosis not present

## 2020-03-21 DIAGNOSIS — S2243XD Multiple fractures of ribs, bilateral, subsequent encounter for fracture with routine healing: Secondary | ICD-10-CM | POA: Diagnosis not present

## 2020-03-21 DIAGNOSIS — M810 Age-related osteoporosis without current pathological fracture: Secondary | ICD-10-CM | POA: Diagnosis not present

## 2020-03-21 DIAGNOSIS — Z1212 Encounter for screening for malignant neoplasm of rectum: Secondary | ICD-10-CM | POA: Diagnosis not present

## 2020-03-21 DIAGNOSIS — Z1211 Encounter for screening for malignant neoplasm of colon: Secondary | ICD-10-CM | POA: Diagnosis not present

## 2020-03-21 DIAGNOSIS — Z1231 Encounter for screening mammogram for malignant neoplasm of breast: Secondary | ICD-10-CM

## 2020-03-21 DIAGNOSIS — Z0000 Encounter for general adult medical examination without abnormal findings: Secondary | ICD-10-CM | POA: Diagnosis not present

## 2020-03-21 DIAGNOSIS — Z20822 Contact with and (suspected) exposure to covid-19: Secondary | ICD-10-CM | POA: Diagnosis not present

## 2020-03-24 ENCOUNTER — Ambulatory Visit
Admission: RE | Admit: 2020-03-24 | Discharge: 2020-03-24 | Disposition: A | Discharge status: Home / Self Care | Payer: PPO | Source: Ambulatory Visit | Attending: Infectious Diseases | Admitting: Infectious Diseases

## 2020-03-24 ENCOUNTER — Other Ambulatory Visit: Payer: Self-pay

## 2020-03-24 DIAGNOSIS — Z1231 Encounter for screening mammogram for malignant neoplasm of breast: Secondary | ICD-10-CM | POA: Diagnosis not present

## 2020-03-24 DIAGNOSIS — M8588 Other specified disorders of bone density and structure, other site: Secondary | ICD-10-CM | POA: Diagnosis not present

## 2020-04-11 LAB — COLOGUARD: COLOGUARD: NEGATIVE

## 2021-01-17 ENCOUNTER — Other Ambulatory Visit: Payer: Self-pay | Admitting: Podiatry

## 2021-01-18 ENCOUNTER — Encounter: Payer: Self-pay | Admitting: Podiatry

## 2021-01-18 NOTE — Discharge Instructions (Addendum)
[place]  POST OPERATIVE INSTRUCTIONS FOR DR. Vickki Muff AND DR. Bethania   Take your medication as prescribed.  Pain medication should be taken only as needed.  You may also take ibuprofen or Tylenol between pain medication doses if pain is still uncontrolled.  Keep the dressing clean, dry and intact.  You may bear weight on your heel only to the left foot.  Make sure to do this when you wearing a surgical shoe to protect the surgical site further.  Try to only stand or walk for very short distances.  You should not be up on your feet walking much at all until the incision heals to the bottom of the foot.  Keep your foot elevated above the heart level for the first 48 hours.  Continue elevation thereafter to improve swelling.  You may also put ice on the foot for maximum 10 minutes out of every 1 hour as needed to control swelling further.  Walking to the bathroom and brief periods of walking are acceptable, unless we have instructed you to be non-weight bearing.  Always wear your post-op shoe when walking.  Always use your crutches if you are to be non-weight bearing.  Do not take a shower. Baths are permissible as long as the foot is kept out of the water.   Every hour you are awake:  Bend your knee 15 times. Flex foot 15 times Massage calf 15 times  Call [facility] [phone]) if any of the following problems occur: You develop a temperature or fever. The bandage becomes saturated with blood. Medication does not stop your pain. Injury of the foot occurs. Any symptoms of infection including redness, odor, or red streaks running from wound.   Information for Discharge Teaching: EXPAREL (bupivacaine liposome injectable suspension)   Your surgeon or anesthesiologist gave you EXPAREL(bupivacaine) to help control your pain after surgery.  EXPAREL is a local anesthetic that provides pain relief by  numbing the tissue around the surgical site. EXPAREL is designed to release pain medication over time and can control pain for up to 72 hours. Depending on how you respond to EXPAREL, you may require less pain medication during your recovery.  Possible side effects: Temporary loss of sensation or ability to move in the area where bupivacaine was injected. Nausea, vomiting, constipation Rarely, numbness and tingling in your mouth or lips, lightheadedness, or anxiety may occur. Call your doctor right away if you think you may be experiencing any of these sensations, or if you have other questions regarding possible side effects.  Follow all other discharge instructions given to you by your surgeon or nurse. Eat a healthy diet and drink plenty of water or other fluids.  If you return to the hospital for any reason within 96 hours following the administration of EXPAREL, it is important for health care providers to know that you have received this anesthetic. A teal colored band has been placed on your arm with the date, time and amount of EXPAREL you have received in order to alert and inform your health care providers. Please leave this armband in place for the full 96 hours following administration, and then you may remove the band.

## 2021-01-19 ENCOUNTER — Ambulatory Visit: Payer: Medicare HMO | Admitting: Anesthesiology

## 2021-01-19 ENCOUNTER — Other Ambulatory Visit: Payer: Self-pay

## 2021-01-19 ENCOUNTER — Ambulatory Visit
Admission: RE | Admit: 2021-01-19 | Discharge: 2021-01-19 | Disposition: A | Discharge status: Home / Self Care | Payer: Medicare HMO | Attending: Podiatry | Admitting: Podiatry

## 2021-01-19 ENCOUNTER — Encounter
Admission: RE | Disposition: A | Discharge status: Home / Self Care | Payer: Self-pay | Source: Home / Self Care | Attending: Podiatry

## 2021-01-19 ENCOUNTER — Encounter: Payer: Self-pay | Admitting: Podiatry

## 2021-01-19 DIAGNOSIS — W458XXA Other foreign body or object entering through skin, initial encounter: Secondary | ICD-10-CM | POA: Diagnosis not present

## 2021-01-19 DIAGNOSIS — S90852A Superficial foreign body, left foot, initial encounter: Secondary | ICD-10-CM | POA: Insufficient documentation

## 2021-01-19 DIAGNOSIS — E7800 Pure hypercholesterolemia, unspecified: Secondary | ICD-10-CM | POA: Diagnosis not present

## 2021-01-19 DIAGNOSIS — S90852D Superficial foreign body, left foot, subsequent encounter: Secondary | ICD-10-CM

## 2021-01-19 HISTORY — PX: FOREIGN BODY REMOVAL: SHX962

## 2021-01-19 SURGERY — REMOVAL FOREIGN BODY EXTREMITY
Anesthesia: General | Site: Foot | Laterality: Left

## 2021-01-19 MED ORDER — 0.9 % SODIUM CHLORIDE (POUR BTL) OPTIME
TOPICAL | Status: DC | PRN
  Administered 2021-01-19: 500 mL

## 2021-01-19 MED ORDER — LACTATED RINGERS IV SOLN: INTRAVENOUS | Status: DC

## 2021-01-19 MED ORDER — FENTANYL CITRATE (PF) 100 MCG/2ML IJ SOLN
INTRAMUSCULAR | Status: DC | PRN
  Administered 2021-01-19: 50 ug via INTRAVENOUS

## 2021-01-19 MED ORDER — LIDOCAINE HCL (CARDIAC) PF 100 MG/5ML IV SOSY
PREFILLED_SYRINGE | INTRAVENOUS | Status: DC | PRN
  Administered 2021-01-19: 50 mg via INTRAVENOUS

## 2021-01-19 MED ORDER — ACETAMINOPHEN 325 MG PO TABS: 325.0000 mg | ORAL_TABLET | ORAL | Status: DC | PRN

## 2021-01-19 MED ORDER — ASPIRIN EC 81 MG PO TBEC: 81.0000 mg | DELAYED_RELEASE_TABLET | Freq: Two times a day (BID) | ORAL | 0 refills | Status: AC

## 2021-01-19 MED ORDER — MIDAZOLAM HCL 2 MG/2ML IJ SOLN
INTRAMUSCULAR | Status: DC | PRN
  Administered 2021-01-19: 1 mg via INTRAVENOUS

## 2021-01-19 MED ORDER — PROPOFOL 500 MG/50ML IV EMUL
INTRAVENOUS | Status: DC | PRN
  Administered 2021-01-19: 50 ug/kg/min via INTRAVENOUS

## 2021-01-19 MED ORDER — OXYCODONE HCL 5 MG PO TABS: 5.0000 mg | ORAL_TABLET | Freq: Once | ORAL | Status: DC | PRN

## 2021-01-19 MED ORDER — POVIDONE-IODINE 7.5 % EX SOLN: Freq: Once | CUTANEOUS | Status: DC

## 2021-01-19 MED ORDER — ONDANSETRON HCL 4 MG/2ML IJ SOLN: 4.0000 mg | Freq: Once | INTRAMUSCULAR | Status: DC | PRN

## 2021-01-19 MED ORDER — BUPIVACAINE LIPOSOME 1.3 % IJ SUSP
INTRAMUSCULAR | Status: DC | PRN
  Administered 2021-01-19: 15 mL

## 2021-01-19 MED ORDER — ACETAMINOPHEN 160 MG/5ML PO SOLN: 325.0000 mg | ORAL | Status: DC | PRN

## 2021-01-19 MED ORDER — HYDROCODONE-ACETAMINOPHEN 7.5-325 MG PO TABS: 1.0000 | ORAL_TABLET | Freq: Four times a day (QID) | ORAL | 0 refills | Status: AC | PRN

## 2021-01-19 MED ORDER — CEFAZOLIN SODIUM-DEXTROSE 2-4 GM/100ML-% IV SOLN
2.0000 g | INTRAVENOUS | Status: AC
  Administered 2021-01-19: 2 g via INTRAVENOUS

## 2021-01-19 MED ORDER — FENTANYL CITRATE PF 50 MCG/ML IJ SOSY: 25.0000 ug | PREFILLED_SYRINGE | INTRAMUSCULAR | Status: DC | PRN

## 2021-01-19 MED ORDER — OXYCODONE HCL 5 MG/5ML PO SOLN
5.0000 mg | Freq: Once | ORAL | Status: DC | PRN
Start: 2021-01-19 — End: 2021-01-19

## 2021-01-19 SURGICAL SUPPLY — 25 items
BNDG CMPR STD VLCR NS LF 5.8X4 (GAUZE/BANDAGES/DRESSINGS) ×1
BNDG ELASTIC 4X5.8 VLCR NS LF (GAUZE/BANDAGES/DRESSINGS) ×2 IMPLANT
BNDG ESMARK 4X12 TAN STRL LF (GAUZE/BANDAGES/DRESSINGS) ×2 IMPLANT
CUFF TOURN SGL QUICK 18X4 (TOURNIQUET CUFF) ×2 IMPLANT
DRAPE FLUOR MINI C-ARM 54X84 (DRAPES) ×2 IMPLANT
DURAPREP 26ML APPLICATOR (WOUND CARE) ×2 IMPLANT
ELECT REM PT RETURN 9FT ADLT (ELECTROSURGICAL) ×2
ELECTRODE REM PT RTRN 9FT ADLT (ELECTROSURGICAL) ×1 IMPLANT
GAUZE SPONGE 4X4 12PLY STRL (GAUZE/BANDAGES/DRESSINGS) ×2 IMPLANT
GAUZE XEROFORM 1X8 LF (GAUZE/BANDAGES/DRESSINGS) ×2 IMPLANT
GLOVE BIO SURGEON STRL SZ7 (GLOVE) ×2 IMPLANT
GOWN STRL REUS W/ TWL LRG LVL3 (GOWN DISPOSABLE) ×2 IMPLANT
GOWN STRL REUS W/TWL LRG LVL3 (GOWN DISPOSABLE) ×4
NEEDLE FILTER BLUNT 18X 1/2SAF (NEEDLE) ×1
NEEDLE FILTER BLUNT 18X1 1/2 (NEEDLE) ×1 IMPLANT
NEEDLE HYPO 25GX1 SAFETY (NEEDLE) ×6 IMPLANT
NS IRRIG 500ML POUR BTL (IV SOLUTION) ×2 IMPLANT
PACK EXTREMITY ARMC (MISCELLANEOUS) ×2 IMPLANT
PADDING CAST BLEND 4X4 NS (MISCELLANEOUS) ×6 IMPLANT
PENCIL SMOKE EVACUATOR (MISCELLANEOUS) ×2 IMPLANT
STOCKINETTE IMPERVIOUS LG (DRAPES) ×2 IMPLANT
SUT ETHILON 3-0 (SUTURE) ×2 IMPLANT
SUT VIC AB 3-0 SH 27 (SUTURE) ×2
SUT VIC AB 3-0 SH 27X BRD (SUTURE) ×1 IMPLANT
SYR 10ML LL (SYRINGE) ×2 IMPLANT

## 2021-01-19 NOTE — Op Note (Signed)
PODIATRY / FOOT AND ANKLE SURGERY OPERATIVE REPORT    SURGEON: Caroline More, DPM  PRE-OPERATIVE DIAGNOSIS:  1.  Left foot foreign body  POST-OPERATIVE DIAGNOSIS: Same  PROCEDURE(S): Left foot incision and drainage with removal of foreign body  HEMOSTASIS: Left ankle tourniquet  ANESTHESIA: MAC plus local  ESTIMATED BLOOD LOSS: 10 cc  FINDING(S): 1.  Foreign body, translucent glass to the plantar aspect the left foot  PATHOLOGY/SPECIMEN(S): Foreign body left foot  INDICATIONS:   VAUDINE DUTAN is a 75 y.o. female who presents with pain to the plantar aspect of the left foot after stepping on a Christmas ornament.  Patient had a puncture wound to the bottom of the foot and believes that there may be a small piece of the ornament still stuck in the foot.  X-ray imaging does show a small piece to the plantar aspect of the foot near the second metatarsal phalange joint area within the subcutaneous tissues.  Attempt was done in clinic to try to remove this under local anesthetic but unable to be retrieved and likely need a deeper incision and drainage.  Discussed all treatment options with the patient both conservative and surgical attempts at correction include potential risks and complications at this time patient is elected for surgical intervention consisting of left foot incision and drainage with removal of foreign body.  No guarantees given.  Consent obtained prior to procedure.  DESCRIPTION: After obtaining full informed written consent, the patient was brought back to the operating room and placed supine upon the operating table.  The patient received IV antibiotics prior to induction.  15 cc of Exparel was injected about the ankle on the left side as a left ankle block and infiltrated locally around the second and third webspace areas going to the plantar foot.  After obtaining adequate anesthesia, the patient was prepped and draped in the standard fashion.  C-arm imaging was  utilized to look at the foreign body present which appeared to be within the superficial subcutaneous tissue near the second metatarsal phalangeal joint plantarly.  Attention was directed to this area on the bottom of the foot and the puncture wound was excised with a 3-1 type of elliptical incision and the incision was made approximately 2 to 3 cm in length.  The skin and wound was ellipsed and passed off in the operative site.  Blunt dissection was continued down with a hemostat through the subcutaneous tissues.  At first no foreign body was present and easily visualized but upon palpation of the soft tissues the foreign material was palpable.  A hemostat was then used to grasp the foreign debris and the foreign body was grasped and located and pulled out of the operative site.  It appeared to be a small translucent piece of glass that measured about 4 mm x 2 mm x 1 mm.  The rest of the soft tissue was explored in this area and no further foreign body or foreign material remained.  The surgical site was flushed with copious amounts normal sterile saline.  C-arm imaging was used to verify that the foreign body had been removed.  Unable to visualize previously seen foreign body under fluoroscopic guidance.  The surgical site was flushed with copious amounts normal sterile saline.  The subcutaneous tissue was reapproximated well coapted with 3-0 Vicryl.  The skin was then reapproximated well coapted with 3-0 nylon in simple type stitching.  A postoperative dressing was then applied consisting of Xeroform followed by 4 x 4 gauze,  Kling, Kerlix, Ace wrap.  The pneumatic ankle tourniquet was deflated and a prompt hyperemic response was noted all digits left foot.  The patient tolerated the procedure and anesthesia well was transferred to recovery room vital signs stable vascular status intact to all toes left foot.  Following a period of postoperative monitoring patient be discharged home with the appropriate orders  and instructions as well as medications.  Patient to remain partial weightbearing with heel contact in the surgical shoe but should try to stay off the left foot is much as possible.  COMPLICATIONS: None  CONDITION: Good, stable  Caroline More, DPM

## 2021-01-19 NOTE — Transfer of Care (Signed)
Immediate Anesthesia Transfer of Care Note  Patient: Sabrina Burns  Procedure(s) Performed: REMOVAL FOREIGN BODY EXTREMITY (Left: Foot)  Patient Location: PACU  Anesthesia Type: General  Level of Consciousness: awake, alert  and patient cooperative  Airway and Oxygen Therapy: Patient Spontanous Breathing and Patient connected to supplemental oxygen  Post-op Assessment: Post-op Vital signs reviewed, Patient's Cardiovascular Status Stable, Respiratory Function Stable, Patent Airway and No signs of Nausea or vomiting  Post-op Vital Signs: Reviewed and stable  Complications: No notable events documented.

## 2021-01-19 NOTE — H&P (Signed)
HISTORY AND PHYSICAL INTERVAL NOTE:  01/19/2021  9:19 AM  Sabrina Burns  has presented today for surgery, with the diagnosis of S90.852D- Froeign body in left foot S91.332D- Puncture wound of left foot.  The various methods of treatment have been discussed with the patient.  No guarantees were given.  After consideration of risks, benefits and other options for treatment, the patient has consented to surgery.  I have reviewed the patients' chart and labs.    PROCEDURE: LEFT FOOT INCISION AND DRAINAGE WITH REMOVAL OF FOREIGN BODY  A history and physical examination was performed in my office.  The patient was reexamined.  There have been no changes to this history and physical examination.  Caroline More, DPM

## 2021-01-19 NOTE — Anesthesia Preprocedure Evaluation (Signed)
Anesthesia Evaluation  Patient identified by MRN, date of birth, ID band Patient awake    Reviewed: Allergy & Precautions, H&P , NPO status , Patient's Chart, lab work & pertinent test results  Airway Mallampati: II  TM Distance: >3 FB Neck ROM: full    Dental no notable dental hx.    Pulmonary    Pulmonary exam normal breath sounds clear to auscultation       Cardiovascular Normal cardiovascular exam Rhythm:regular Rate:Normal     Neuro/Psych Depression    GI/Hepatic   Endo/Other    Renal/GU      Musculoskeletal   Abdominal   Peds  Hematology   Anesthesia Other Findings   Reproductive/Obstetrics                             Anesthesia Physical Anesthesia Plan  ASA: 2  Anesthesia Plan: General   Post-op Pain Management:    Induction:   PONV Risk Score and Plan: 3 and Treatment may vary due to age or medical condition, Ondansetron, Dexamethasone and TIVA  Airway Management Planned:   Additional Equipment:   Intra-op Plan:   Post-operative Plan:   Informed Consent: I have reviewed the patients History and Physical, chart, labs and discussed the procedure including the risks, benefits and alternatives for the proposed anesthesia with the patient or authorized representative who has indicated his/her understanding and acceptance.     Dental Advisory Given  Plan Discussed with: CRNA  Anesthesia Plan Comments:         Anesthesia Quick Evaluation

## 2021-01-19 NOTE — Anesthesia Postprocedure Evaluation (Signed)
Anesthesia Post Note  Patient: Sabrina Burns  Procedure(s) Performed: REMOVAL FOREIGN BODY EXTREMITY (Left: Foot)     Patient location during evaluation: PACU Anesthesia Type: General Level of consciousness: awake and alert and oriented Pain management: satisfactory to patient Vital Signs Assessment: post-procedure vital signs reviewed and stable Respiratory status: spontaneous breathing, nonlabored ventilation and respiratory function stable Cardiovascular status: blood pressure returned to baseline and stable Postop Assessment: Adequate PO intake and No signs of nausea or vomiting Anesthetic complications: no   No notable events documented.  Raliegh Ip

## 2021-01-19 NOTE — Anesthesia Procedure Notes (Signed)
Procedure Name: MAC Date/Time: 01/19/2021 9:36 AM Performed by: Mayme Genta, CRNA Pre-anesthesia Checklist: Patient identified, Emergency Drugs available, Suction available, Patient being monitored and Timeout performed Patient Re-evaluated:Patient Re-evaluated prior to induction Oxygen Delivery Method: Simple face mask Placement Confirmation: positive ETCO2 and breath sounds checked- equal and bilateral

## 2021-07-11 ENCOUNTER — Encounter: Payer: Self-pay | Admitting: *Deleted

## 2021-07-11 ENCOUNTER — Emergency Department
Admission: EM | Admit: 2021-07-11 | Discharge: 2021-07-12 | Disposition: A | Discharge status: Home / Self Care | Payer: Medicare HMO | Attending: Emergency Medicine | Admitting: Emergency Medicine

## 2021-07-11 ENCOUNTER — Other Ambulatory Visit: Payer: Self-pay

## 2021-07-11 ENCOUNTER — Emergency Department: Payer: Medicare HMO

## 2021-07-11 DIAGNOSIS — R7989 Other specified abnormal findings of blood chemistry: Secondary | ICD-10-CM | POA: Insufficient documentation

## 2021-07-11 DIAGNOSIS — R110 Nausea: Secondary | ICD-10-CM | POA: Diagnosis not present

## 2021-07-11 DIAGNOSIS — Z85820 Personal history of malignant melanoma of skin: Secondary | ICD-10-CM | POA: Diagnosis not present

## 2021-07-11 DIAGNOSIS — M25512 Pain in left shoulder: Secondary | ICD-10-CM | POA: Insufficient documentation

## 2021-07-11 DIAGNOSIS — R42 Dizziness and giddiness: Secondary | ICD-10-CM | POA: Diagnosis not present

## 2021-07-11 DIAGNOSIS — R0789 Other chest pain: Secondary | ICD-10-CM | POA: Diagnosis not present

## 2021-07-11 DIAGNOSIS — R079 Chest pain, unspecified: Secondary | ICD-10-CM | POA: Diagnosis present

## 2021-07-11 LAB — BASIC METABOLIC PANEL
Anion gap: 9 (ref 5–15)
BUN: 14 mg/dL (ref 8–23)
CO2: 27 mmol/L (ref 22–32)
Calcium: 9.9 mg/dL (ref 8.9–10.3)
Chloride: 105 mmol/L (ref 98–111)
Creatinine, Ser: 1.19 mg/dL — ABNORMAL HIGH (ref 0.44–1.00)
GFR, Estimated: 48 mL/min — ABNORMAL LOW (ref >60)
Glucose, Bld: 134 mg/dL — ABNORMAL HIGH (ref 70–99)
Potassium: 4.3 mmol/L (ref 3.5–5.1)
Sodium: 141 mmol/L (ref 135–145)

## 2021-07-11 LAB — CBC
HCT: 42.3 % (ref 36.0–46.0)
Hemoglobin: 14 g/dL (ref 12.0–15.0)
MCH: 30.8 pg (ref 26.0–34.0)
MCHC: 33.1 g/dL (ref 30.0–36.0)
MCV: 93.2 fL (ref 80.0–100.0)
Platelets: 206 10*3/uL (ref 150–400)
RBC: 4.54 MIL/uL (ref 3.87–5.11)
RDW: 11.9 % (ref 11.5–15.5)
WBC: 5.6 10*3/uL (ref 4.0–10.5)
nRBC: 0 % (ref 0.0–0.2)

## 2021-07-11 LAB — TROPONIN I (HIGH SENSITIVITY): Troponin I (High Sensitivity): 4 ng/L (ref <18)

## 2021-07-11 MED ORDER — KETOROLAC TROMETHAMINE 30 MG/ML IJ SOLN
15.0000 mg | Freq: Once | INTRAMUSCULAR | Status: AC
Start: 2021-07-11 — End: 2021-07-11
  Administered 2021-07-11: 15 mg via INTRAVENOUS
  Filled 2021-07-11: qty 1

## 2021-07-11 NOTE — ED Triage Notes (Signed)
Pt has a sharp pain beneath left breast.  Pt also reports pain in left shoulder and left arm. Pt has sob.  Nonsmoker.  Pt has nausea.  Pt alert  speech clear.  Sx began at 1630 today  ?

## 2021-07-11 NOTE — ED Provider Notes (Signed)
? ?[facility] ?Provider Note ? ? ? Event Date/Time  ? First MD Initiated Contact with Patient 07/11/21 2303   ?  (approximate) ? ? ?History  ? ?Chest Pain ? ? ?HPI ? ?Sabrina Burns is a 75 y.o. female with history of hyperlipidemia, mitral valve prolapse who presents to the emergency department with her daughter for concerns for left-sided chest pain that started about 4:30 PM today.  She describes as a sharp pain under her left breast that is worse with palpation and deep inspiration.  States she has had an aching discomfort in her left shoulder and upper arm.  She had some lightheadedness and nausea.  No shortness of breath, vomiting, diaphoresis.  No fever or cough.  No history of PE or DVT.  No history of CAD, CHF. ? ? ?History provided by patient and daughter. ? ? ? ?Past Medical History:  ?Diagnosis Date  ? Cancer ([place]) 02/2017  ? 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  ? Complication of anesthesia   ? hypotension  with bladder surgery, hard to wake up  ? Depression   ? H/O: rheumatic fever   ? Hypercholesteremia unkn  ? Mitral valve prolapse   ? Osteoporosis   ? ? ?Past Surgical History:  ?Procedure Laterality Date  ? ABDOMINAL HYSTERECTOMY    ? BACK SURGERY    ? lumbar  ? CATARACT EXTRACTION W/PHACO Right 11/12/2018  ? Procedure: CATARACT EXTRACTION PHACO AND INTRAOCULAR LENS PLACEMENT ([place]) RIGHT;  Surgeon: Leandrew Koyanagi, MD;  Location: [place];  Service: Ophthalmology;  Laterality: Right;  ? CATARACT EXTRACTION W/PHACO Left 12/03/2018  ? Procedure: CATARACT EXTRACTION PHACO AND INTRAOCULAR LENS PLACEMENT ([place])  LEFT;  Surgeon: Leandrew Koyanagi, MD;  Location: [place];  Service: Ophthalmology;  Laterality: Left;  Total Time: 00:57 ?Total Equivalent Power: 12.3% ?Cumulative Dissipated Energy: 7.05  ? COLONOSCOPY    ? FOOT SURGERY Right   ? FOREIGN BODY REMOVAL Left 01/19/2021  ? Procedure: REMOVAL FOREIGN BODY EXTREMITY;  Surgeon: Caroline More, DPM;   Location: [place];  Service: Podiatry;  Laterality: Left;  ? pelvic sling    ? SHOULDER ARTHROSCOPY WITH OPEN ROTATOR CUFF REPAIR Right 09/15/2015  ? Procedure: SHOULDER ARTHROSCOPY WITH OPEN ROTATOR CUFF REPAIR;  Surgeon: Corky Mull, MD;  Location: [facility];  Service: Orthopedics;  Laterality: Right;  ? SUTURE REMOVAL Left 07/03/2017  ? Procedure: REMOVAL OF RETAINED SUTURE FROM LEFT WRIST;  Surgeon: Corky Mull, MD;  Location: [place];  Service: Orthopedics;  Laterality: Left;  ? TONSILLECTOMY    ? TUBAL LIGATION    ? WRIST ARTHROSCOPY WITH FOVEAL TRIANGULAR FIBROCARTILAGE COMPLEX REPAIR Left 03/28/2017  ? Procedure: WRIST ARTHROSCOPY WITH DEBRIDEMENT AND REPAIR OF TFCC TEAR;  Surgeon: Corky Mull, MD;  Location: [facility];  Service: Orthopedics;  Laterality: Left;  ? ? ?MEDICATIONS:  ?Prior to Admission medications   ?Medication Sig Start Date End Date Taking? Authorizing Provider  ?cefdinir (OMNICEF) 300 MG capsule Take 300 mg by mouth 2 (two) times daily.    [provider]  ?citalopram (CELEXA) 20 MG tablet Take 20 mg by mouth daily. In am.    [provider]  ?simvastatin (ZOCOR) 20 MG tablet Take 20 mg by mouth daily at 6 PM.     [provider]  ? ? ?Physical Exam  ? ?Triage Vital Signs: ?ED Triage Vitals  ?Enc Vitals Group  ?   BP 07/11/21 1919 (!) 146/73  ?  Pulse Rate 07/11/21 1919 76  ?   Resp 07/11/21 1919 18  ?   Temp 07/11/21 1919 98.8 ?F (37.1 ?C)  ?   Temp Source 07/11/21 1919 Oral  ?   SpO2 07/11/21 1919 95 %  ?   Weight 07/11/21 1914 137 lb (62.1 kg)  ?   Height 07/11/21 1914 '5\' 2"'$  (1.575 m)  ?   Head Circumference --   ?   Peak Flow --   ?   Pain Score 07/11/21 1914 8  ?   Pain Loc --   ?   Pain Edu? --   ?   Excl. in [place]? --   ? ? ?Most recent vital signs: ?Vitals:  ? 07/11/21 2315 07/11/21 2330  ?BP: (!) 171/77 (!) 179/88  ?Pulse: 62 62  ?Resp: 16 14  ?Temp:    ?SpO2: 98% 100%  ? ? ?CONSTITUTIONAL: Alert and oriented and responds  appropriately to questions. Well-appearing; well-nourished ?HEAD: Normocephalic, atraumatic ?EYES: Conjunctivae clear, pupils appear equal, sclera nonicteric ?ENT: normal nose; moist mucous membranes ?NECK: Supple, normal ROM ?CARD: RRR; S1 and S2 appreciated; no murmurs, no clicks, no rubs, no gallops ?CHEST:  Chest wall is tender to palpation over the lower left chest wall which reproduces her pain.  No crepitus, ecchymosis, erythema, warmth, rash or other lesions present.   ?RESP: Normal chest excursion without splinting or tachypnea; breath sounds clear and equal bilaterally; no wheezes, no rhonchi, no rales, no hypoxia or respiratory distress, speaking full sentences ?ABD/GI: Normal bowel sounds; non-distended; soft, non-tender, no rebound, no guarding, no peritoneal signs ?BACK: The back appears normal ?EXT: Normal ROM in all joints; no deformity noted, no edema; no cyanosis, no calf tenderness or calf swelling ?SKIN: Normal color for age and race; warm; no rash on exposed skin ?NEURO: Moves all extremities equally, normal speech ?PSYCH: The patient's mood and manner are appropriate. ? ? ?ED Results / Procedures / Treatments  ? ?LABS: ?(all labs ordered are listed, but only abnormal results are displayed) ?Labs Reviewed  ?BASIC METABOLIC PANEL - Abnormal; Notable for the following components:  ?    Result Value  ? Glucose, Bld 134 (*)   ? Creatinine, Ser 1.19 (*)   ? GFR, Estimated 48 (*)   ? All other components within normal limits  ?CBC  ?D-DIMER, QUANTITATIVE  ?TROPONIN I (HIGH SENSITIVITY)  ?TROPONIN I (HIGH SENSITIVITY)  ? ? ? ?EKG: ? EKG Interpretation ? ?Date/Time:  Tuesday July 11 2021 18:52:57 EDT ?Ventricular Rate:  72 ?PR Interval:  148 ?QRS Duration: 68 ?QT Interval:  398 ?QTC Calculation: 435 ?R Axis:   47 ?Text Interpretation: Normal sinus rhythm Cannot rule out Anterior infarct , age undetermined Abnormal ECG When compared with ECG of 29-May-2018 17:31, PREVIOUS ECG IS PRESENT Confirmed by  Pryor Curia [phone]) on 07/11/2021 11:13:17 PM ?  ? ?  ? ? ? ?RADIOLOGY: ?My personal review and interpretation of imaging: Chest x-ray clear. ? ?I have personally reviewed all radiology reports.   ?DG Chest 2 View ? ?Result Date: 07/11/2021 ?CLINICAL DATA:  Chest pain EXAM: CHEST - 2 VIEW COMPARISON:  None. FINDINGS: Cardiac and mediastinal contours are within normal limits. Left basilar atelectasis and/or scarring. No additional focal pulmonary opacity. No pleural effusion or pneumothorax. Healed left rib fractures. Compression deformity of T10, which is technically age indeterminate. IMPRESSION: 1.  No acute cardiopulmonary process. 2. Compression deformity of T10, which is technically age indeterminate. Correlate with point tenderness. Consider CT or MRI if  clinically indicated. Electronically Signed   By: Merilyn Baba M.D.   On: 07/11/2021 19:47   ? ? ?PROCEDURES: ? ?Critical Care performed: No ? ? ? ? ? ?.1-3 Lead EKG Interpretation ?Performed by: Kynnedy Carreno, Delice Bison, DO ?Authorized by: Jendayi Berling, Delice Bison, DO  ? ?  Interpretation: normal   ?  ECG rate:  76 ?  ECG rate assessment: normal   ?  Rhythm: sinus rhythm   ?  Ectopy: none   ?  Conduction: normal   ? ? ? ?IMPRESSION / MDM / ASSESSMENT AND PLAN / ED COURSE  ?I reviewed the triage vital signs and the nursing notes. ? ? ? ?Patient here with sharp, pleuritic left-sided chest pain. ? ?The patient is on the cardiac monitor to evaluate for evidence of arrhythmia and/or significant heart rate changes. ? ? ?DIFFERENTIAL DIAGNOSIS (includes but not limited to):   Musculoskeletal chest wall pain, costochondritis, PE, pneumonia, CHF, ACS, pneumothorax, rib fracture, dissection ? ? ?PLAN: We will obtain CBC, BMP, troponin x2, D-dimer, chest x-ray.  EKG nonischemic.  Will give low-dose IV Toradol for pain control.  Symptoms seem very atypical for ACS. ? ? ?MEDICATIONS GIVEN IN ED: ?Medications  ?ketorolac (TORADOL) 30 MG/ML injection 15 mg (15 mg Intravenous Given 07/11/21  2348)  ? ? ? ?ED COURSE: Patient's cardiac enzymes are normal.  Troponins flat x2.  D-dimer negative.  Normal hemoglobin.  No leukocytosis.  Very minimally elevated creatinine at 1.19.  Normal electrolytes.  Chest x-ray re

## 2021-07-12 LAB — D-DIMER, QUANTITATIVE: D-Dimer, Quant: 0.4 ug/mL-FEU (ref 0.00–0.50)

## 2021-07-12 LAB — TROPONIN I (HIGH SENSITIVITY): Troponin I (High Sensitivity): 4 ng/L (ref <18)

## 2021-07-12 NOTE — Discharge Instructions (Signed)
You may take over-the-counter Tylenol 1000 mg every 6 hours as needed for pain.  Your cardiac work-up today was reassuring including a normal EKG, chest x-ray, cardiac enzymes and a blood test called a D-dimer that rules out blood clots.  I suspect that your pain is musculoskeletal in nature.  If your symptoms are not improving over the next several days, recommend follow-up with your primary care doctor.  If you begin having chest pressure/tightness, shortness of breath, sudden sweating, feel like you are going to pass out, please return to the emergency department. ?

## 2021-07-12 NOTE — ED Notes (Signed)
Pt discharge information reviewed. Pt understands need for follow up care and when to return if symptoms worsen. All questions answered. Pt is alert and oriented with even and regular respirations. Pt is seen ambulating out of department with string steady gait.   

## 2022-04-26 DIAGNOSIS — Z8619 Personal history of other infectious and parasitic diseases: Secondary | ICD-10-CM

## 2022-04-26 HISTORY — DX: Personal history of other infectious and parasitic diseases: Z86.19

## 2022-05-08 ENCOUNTER — Other Ambulatory Visit: Payer: Self-pay | Admitting: Infectious Diseases

## 2022-05-08 DIAGNOSIS — Z1231 Encounter for screening mammogram for malignant neoplasm of breast: Secondary | ICD-10-CM

## 2022-06-14 ENCOUNTER — Ambulatory Visit
Admission: RE | Admit: 2022-06-14 | Discharge: 2022-06-14 | Disposition: A | Discharge status: Home / Self Care | Payer: Medicare HMO | Source: Ambulatory Visit | Attending: Infectious Diseases | Admitting: Infectious Diseases

## 2022-06-14 DIAGNOSIS — Z1231 Encounter for screening mammogram for malignant neoplasm of breast: Secondary | ICD-10-CM | POA: Diagnosis not present

## 2022-11-11 ENCOUNTER — Encounter: Payer: Self-pay | Admitting: *Deleted

## 2022-11-11 ENCOUNTER — Emergency Department: Payer: Medicare HMO

## 2022-11-11 ENCOUNTER — Other Ambulatory Visit: Payer: Self-pay

## 2022-11-11 ENCOUNTER — Inpatient Hospital Stay
Admission: EM | Admit: 2022-11-11 | Discharge: 2022-11-13 | DRG: 444 | Disposition: A | Discharge status: Home / Self Care | Payer: Medicare HMO | Attending: Internal Medicine | Admitting: Internal Medicine

## 2022-11-11 DIAGNOSIS — K8020 Calculus of gallbladder without cholecystitis without obstruction: Principal | ICD-10-CM | POA: Diagnosis present

## 2022-11-11 DIAGNOSIS — K8080 Other cholelithiasis without obstruction: Secondary | ICD-10-CM

## 2022-11-11 DIAGNOSIS — M810 Age-related osteoporosis without current pathological fracture: Secondary | ICD-10-CM | POA: Diagnosis present

## 2022-11-11 DIAGNOSIS — E7800 Pure hypercholesterolemia, unspecified: Secondary | ICD-10-CM | POA: Diagnosis present

## 2022-11-11 DIAGNOSIS — K319 Disease of stomach and duodenum, unspecified: Secondary | ICD-10-CM

## 2022-11-11 DIAGNOSIS — E872 Acidosis, unspecified: Secondary | ICD-10-CM | POA: Diagnosis present

## 2022-11-11 DIAGNOSIS — R933 Abnormal findings on diagnostic imaging of other parts of digestive tract: Secondary | ICD-10-CM

## 2022-11-11 DIAGNOSIS — R109 Unspecified abdominal pain: Secondary | ICD-10-CM | POA: Diagnosis not present

## 2022-11-11 DIAGNOSIS — Z803 Family history of malignant neoplasm of breast: Secondary | ICD-10-CM

## 2022-11-11 DIAGNOSIS — F32A Depression, unspecified: Secondary | ICD-10-CM | POA: Diagnosis present

## 2022-11-11 DIAGNOSIS — Z79899 Other long term (current) drug therapy: Secondary | ICD-10-CM

## 2022-11-11 DIAGNOSIS — Z90710 Acquired absence of both cervix and uterus: Secondary | ICD-10-CM

## 2022-11-11 DIAGNOSIS — R748 Abnormal levels of other serum enzymes: Secondary | ICD-10-CM

## 2022-11-11 DIAGNOSIS — R1010 Upper abdominal pain, unspecified: Secondary | ICD-10-CM

## 2022-11-11 DIAGNOSIS — K5730 Diverticulosis of large intestine without perforation or abscess without bleeding: Secondary | ICD-10-CM | POA: Diagnosis present

## 2022-11-11 DIAGNOSIS — E042 Nontoxic multinodular goiter: Secondary | ICD-10-CM | POA: Diagnosis present

## 2022-11-11 DIAGNOSIS — Z85828 Personal history of other malignant neoplasm of skin: Secondary | ICD-10-CM

## 2022-11-11 DIAGNOSIS — K859 Acute pancreatitis without necrosis or infection, unspecified: Secondary | ICD-10-CM | POA: Diagnosis not present

## 2022-11-11 DIAGNOSIS — E785 Hyperlipidemia, unspecified: Secondary | ICD-10-CM

## 2022-11-11 DIAGNOSIS — K851 Biliary acute pancreatitis without necrosis or infection: Secondary | ICD-10-CM | POA: Diagnosis present

## 2022-11-11 HISTORY — DX: Hyperlipidemia, unspecified: E78.5

## 2022-11-11 LAB — CBC
HCT: 42.9 % (ref 36.0–46.0)
Hemoglobin: 14.4 g/dL (ref 12.0–15.0)
MCH: 31.3 pg (ref 26.0–34.0)
MCHC: 33.6 g/dL (ref 30.0–36.0)
MCV: 93.3 fL (ref 80.0–100.0)
Platelets: 208 10*3/uL (ref 150–400)
RBC: 4.6 MIL/uL (ref 3.87–5.11)
RDW: 12 % (ref 11.5–15.5)
WBC: 7.3 10*3/uL (ref 4.0–10.5)
nRBC: 0 % (ref 0.0–0.2)

## 2022-11-11 LAB — BASIC METABOLIC PANEL
Anion gap: 10 (ref 5–15)
BUN: 19 mg/dL (ref 8–23)
CO2: 23 mmol/L (ref 22–32)
Calcium: 9 mg/dL (ref 8.9–10.3)
Chloride: 105 mmol/L (ref 98–111)
Creatinine, Ser: 0.99 mg/dL (ref 0.44–1.00)
GFR, Estimated: 59 mL/min — ABNORMAL LOW (ref >60)
Glucose, Bld: 118 mg/dL — ABNORMAL HIGH (ref 70–99)
Potassium: 4 mmol/L (ref 3.5–5.1)
Sodium: 138 mmol/L (ref 135–145)

## 2022-11-11 LAB — HEPATIC FUNCTION PANEL
ALT: 17 U/L (ref 0–44)
AST: 18 U/L (ref 15–41)
Albumin: 4.2 g/dL (ref 3.5–5.0)
Alkaline Phosphatase: 49 U/L (ref 38–126)
Bilirubin, Direct: 0.2 mg/dL (ref 0.0–0.2)
Indirect Bilirubin: 1.3 mg/dL — ABNORMAL HIGH (ref 0.3–0.9)
Total Bilirubin: 1.5 mg/dL — ABNORMAL HIGH (ref 0.3–1.2)
Total Protein: 6.7 g/dL (ref 6.5–8.1)

## 2022-11-11 LAB — LIPID PANEL
Cholesterol: 162 mg/dL (ref 0–200)
HDL: 54 mg/dL (ref >40)
LDL Cholesterol: 87 mg/dL (ref 0–99)
Total CHOL/HDL Ratio: 3 RATIO
Triglycerides: 106 mg/dL (ref <150)
VLDL: 21 mg/dL (ref 0–40)

## 2022-11-11 LAB — TROPONIN I (HIGH SENSITIVITY)
Troponin I (High Sensitivity): 3 ng/L (ref <18)
Troponin I (High Sensitivity): 3 ng/L (ref <18)

## 2022-11-11 LAB — LIPASE, BLOOD: Lipase: 466 U/L — ABNORMAL HIGH (ref 11–51)

## 2022-11-11 MED ORDER — IOHEXOL 300 MG/ML  SOLN
100.0000 mL | Freq: Once | INTRAMUSCULAR | Status: AC | PRN
  Administered 2022-11-11: 100 mL via INTRAVENOUS

## 2022-11-11 MED ORDER — SODIUM CHLORIDE 0.9 % IV BOLUS
1000.0000 mL | Freq: Once | INTRAVENOUS | Status: AC
  Administered 2022-11-11: 1000 mL via INTRAVENOUS

## 2022-11-11 MED ORDER — ENOXAPARIN SODIUM 40 MG/0.4ML IJ SOSY: 40.0000 mg | PREFILLED_SYRINGE | INTRAMUSCULAR | Status: DC

## 2022-11-11 MED ORDER — ONDANSETRON HCL 4 MG/2ML IJ SOLN
4.0000 mg | Freq: Once | INTRAMUSCULAR | Status: AC
  Administered 2022-11-11: 4 mg via INTRAVENOUS
  Filled 2022-11-11: qty 2

## 2022-11-11 MED ORDER — SODIUM CHLORIDE 0.9 % IV SOLN: INTRAVENOUS | Status: DC

## 2022-11-11 MED ORDER — PANTOPRAZOLE INFUSION (NEW) - SIMPLE MED
8.0000 mg/h | INTRAVENOUS | Status: DC
  Administered 2022-11-11 – 2022-11-13 (×3): 8 mg/h via INTRAVENOUS
  Filled 2022-11-11 (×5): qty 100

## 2022-11-11 MED ORDER — MORPHINE SULFATE (PF) 4 MG/ML IV SOLN
4.0000 mg | INTRAVENOUS | Status: DC | PRN
  Administered 2022-11-11 – 2022-11-12 (×4): 4 mg via INTRAVENOUS
  Filled 2022-11-11 (×4): qty 1

## 2022-11-11 MED ORDER — PANTOPRAZOLE 80MG IVPB - SIMPLE MED
80.0000 mg | Freq: Once | INTRAVENOUS | Status: AC
  Administered 2022-11-11: 80 mg via INTRAVENOUS
  Filled 2022-11-11: qty 100

## 2022-11-11 MED ORDER — PANTOPRAZOLE SODIUM 40 MG IV SOLR: 40.0000 mg | Freq: Two times a day (BID) | INTRAVENOUS | Status: DC

## 2022-11-11 MED ORDER — ONDANSETRON HCL 4 MG PO TABS: 4.0000 mg | ORAL_TABLET | Freq: Four times a day (QID) | ORAL | Status: DC | PRN

## 2022-11-11 MED ORDER — ONDANSETRON HCL 4 MG/2ML IJ SOLN
4.0000 mg | Freq: Four times a day (QID) | INTRAMUSCULAR | Status: DC | PRN
  Administered 2022-11-12 (×2): 4 mg via INTRAVENOUS
  Filled 2022-11-11 (×3): qty 2

## 2022-11-11 MED ORDER — MORPHINE SULFATE (PF) 4 MG/ML IV SOLN
4.0000 mg | Freq: Once | INTRAVENOUS | Status: AC
  Administered 2022-11-11: 4 mg via INTRAVENOUS
  Filled 2022-11-11: qty 1

## 2022-11-11 NOTE — Assessment & Plan Note (Signed)
Noted Cholelithiasis without gallbladder wall thickening or sonographic Murphy sign on imaging  No significant RUQ abd pain on exam  Monitor

## 2022-11-11 NOTE — Assessment & Plan Note (Signed)
Patient with history of thyroid nodules. Negative FNA 05/20/2020  Previously evaluated in the [facility] system Monitor

## 2022-11-11 NOTE — Assessment & Plan Note (Signed)
Cont celexa.   

## 2022-11-11 NOTE — Assessment & Plan Note (Signed)
Cont statin

## 2022-11-11 NOTE — ED Provider Notes (Signed)
[facility] Provider Note    Event Date/Time   First MD Initiated Contact with Patient 11/11/22 [phone]     (approximate)   History   Chest Pain   HPI  Sabrina Burns is a 77 y.o. female   Past medical history of osteoporosis, hyperlipidemia presents emergency department with chest pain.  Started today.  Left-sided chest pain radiating to the left shoulder.  Heavy lifting today putting up a new fence and using a sledgehammer.  No traumatic injuries or inciting event per patient report.  Radiates to upper abdominal area as well with some nausea.    No GU symptoms.  No Respiratory symptoms.    Independent Historian contributed to assessment above: Daughter is at bedside corroborates information past medical history as above.       Physical Exam   Triage Vital Signs: ED Triage Vitals  Encounter Vitals Group     BP 11/11/22 0142 (!) 159/87     Systolic BP Percentile --      Diastolic BP Percentile --      Pulse Rate 11/11/22 0142 (!) 59     Resp 11/11/22 0142 20     Temp 11/11/22 0142 97.9 F (36.6 C)     Temp Source 11/11/22 0142 Oral     SpO2 11/11/22 0142 97 %     Weight --      Height --      Head Circumference --      Peak Flow --      Pain Score 11/11/22 0140 8     Pain Loc --      Pain Education --      Exclude from Growth Chart --     Most recent vital signs: Vitals:   11/11/22 0142 11/11/22 0501  BP: (!) 159/87 (!) 129/98  Pulse: (!) 59 67  Resp: 20 (!) 68  Temp: 97.9 F (36.6 C)   SpO2: 97% 97%    General: Awake, no distress.  CV:  Good peripheral perfusion.  Resp:  Normal effort.  Abd:  No distention.  Other:  Clear lungs, radial pulses intact equal bilaterally.  Epigastric tenderness to palpation.  No rigidity or guarding.   ED Results / Procedures / Treatments   Labs (all labs ordered are listed, but only abnormal results are displayed) Labs Reviewed  BASIC METABOLIC PANEL - Abnormal; Notable for the  following components:      Result Value   Glucose, Bld 118 (*)    GFR, Estimated 59 (*)    All other components within normal limits  HEPATIC FUNCTION PANEL - Abnormal; Notable for the following components:   Total Bilirubin 1.5 (*)    Indirect Bilirubin 1.3 (*)    All other components within normal limits  LIPASE, BLOOD - Abnormal; Notable for the following components:   Lipase 466 (*)    All other components within normal limits  CBC  TROPONIN I (HIGH SENSITIVITY)  TROPONIN I (HIGH SENSITIVITY)     I ordered and reviewed the above labs they are notable for lipase markedly elevated in 400s.  EKG  ED ECG REPORT I, Pilar Jarvis, the attending physician, personally viewed and interpreted this ECG.   Date: 11/11/2022  EKG Time: 0149  Rate: 60  Rhythm: sinus  Axis: nl  Intervals:none  ST&T Change: no stemi    RADIOLOGY I independently reviewed and interpreted chest x-ray and see no obvious pneumothorax I also reviewed radiologist's formal read.   PROCEDURES:  Critical Care performed: No  Procedures   MEDICATIONS ORDERED IN ED: Medications  sodium chloride 0.9 % bolus 1,000 mL (has no administration in time range)  morphine (PF) 4 MG/ML injection 4 mg (has no administration in time range)  ondansetron (ZOFRAN) injection 4 mg (has no administration in time range)    External physician / consultants:  I spoke with hospitalist for admission and regarding care plan for this patient.   IMPRESSION / MDM / ASSESSMENT AND PLAN / ED COURSE  I reviewed the triage vital signs and the nursing notes.                                Patient's presentation is most consistent with acute presentation with potential threat to life or bodily function.  Differential diagnosis includes, but is not limited to, ACS, PE, dissection, pneumothorax, musculoskeletal pain, costochondritis, pancreatitis, biliary colic/cholecystitis, GERD/gastritis   The patient is on the cardiac monitor  to evaluate for evidence of arrhythmia and/or significant heart rate changes.  MDM:    Is a patient with nonspecific chest pain, would be atypical for ACS given the tenderness to palpation in the chest area as well as the epigastrium.  Troponin initial is negative and EKG nonischemic, doubt ACS.  Lipase is elevated in the 400s, consistent with pancreatitis.  No right upper quadrant tenderness, LFTs normal, normal white blood cell count doubt cholecystitis, will check CT scan of the abdomen pelvis to assess for gallstones or complicated pancreatitis, give fluids and pain medications antiemetic and admission.       FINAL CLINICAL IMPRESSION(S) / ED DIAGNOSES   Final diagnoses:  Acute pancreatitis, unspecified complication status, unspecified pancreatitis type     Rx / DC Orders   ED Discharge Orders     None        Note:  This document was prepared using Dragon voice recognition software and may include unintentional dictation errors.    Pilar Jarvis, MD 11/11/22 [phone]

## 2022-11-11 NOTE — Progress Notes (Signed)
Patient arrived to room in no acute distress. Patient oriented to room, call bell in reach.

## 2022-11-11 NOTE — Assessment & Plan Note (Signed)
Positive progressive onset of generalized abdominal pain over the past 24 hours Lipase 466 No overt pancreatitis on imaging though with gastric lesion as well as cholelithiasis without Cholecystitis LFTs WNL  Case discussed with Dr. Tobi Bastos with gastroenterology Predominant concern for gastritis IV PPI Pain control Antiemetics IV fluid hydration Follow-up formal gastroenterology recommendations

## 2022-11-11 NOTE — Consult Note (Signed)
[name] , MD [address], [address], [place], [place], [address] [address], [place], [place], [address] Phone: [phone]  Fax: [phone]  Consultation  Referring Provider:     Dr Alvester Morin Primary Care Physician:  Mick Sell, MD Primary Gastroenterologist: None      Reason for Consultation:     Abnormal Ct scan   Date of Admission:  11/11/2022 Date of Consultation:  11/11/2022         HPI:   Sabrina Burns is a 77 y.o. female with history of hyperlipidemia mitral valve prolapse presents to the hospital with abdominal pain which began over the past 1 day that was severe but has had it on and off for a week , points to her epigastrium and radiated to the LUQ and RUQ, no aggrevating or relieving factors particularly food intake or bowel movements.   In the emergency room underwent a chest x-ray that showed no abnormalities following which underwent a CT scan of the abdomen pelvis with contrast demonstrated no features of acute pancreatitis but focal soft tissue thickening in the posterior gastric fundus with an exophytic 9 mm soft tissue component may be diverticulum or a mural nodule.  There is 6 mm lesion in the gallbladder probably a stone although a gallbladder polyp not excluded.  Left colonic diverticulosis.  No intrahepatic or extrahepatic biliary dilation.  This was followed up with a right upper quadrant ultrasound which demonstrated a common bile duct of diameter of 5 to 6 mm.  11 mm gallstone identified but no gallbladder wall thickening.  On admission creatinine 0.99 normal white cell count, hemoglobin 14.4 g transaminases not elevated.  Indirect hyperbilirubinemia noted.  Lipase 466.  Triglycerides 106.  Home medication list reviewed.  She denies any prior history of pancreatitis, no new medications except b12 and magnesium , no weight loss drugs, no family histor of pancreatitis. No nsaid use .    Past Medical History:  Diagnosis Date    Cancer (HCC) 02/2017   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   Complication of anesthesia    hypotension  with bladder surgery, hard to wake up   Depression    H/O: rheumatic fever    HLD (hyperlipidemia) 11/11/2022   Hypercholesteremia unkn   Mitral valve prolapse    Osteoporosis     Past Surgical History:  Procedure Laterality Date   ABDOMINAL HYSTERECTOMY     BACK SURGERY     lumbar   CATARACT EXTRACTION W/PHACO Right 11/12/2018   Procedure: CATARACT EXTRACTION PHACO AND INTRAOCULAR LENS PLACEMENT (IOC) RIGHT;  Surgeon: Lockie Mola, MD;  Location: [facility];  Service: Ophthalmology;  Laterality: Right;   CATARACT EXTRACTION W/PHACO Left 12/03/2018   Procedure: CATARACT EXTRACTION PHACO AND INTRAOCULAR LENS PLACEMENT (IOC)  LEFT;  Surgeon: Lockie Mola, MD;  Location: [facility];  Service: Ophthalmology;  Laterality: Left;  Total Time: 00:57 Total Equivalent Power: 12.3% Cumulative Dissipated Energy: 7.05   COLONOSCOPY     FOOT SURGERY Right    FOREIGN BODY REMOVAL Left 01/19/2021   Procedure: REMOVAL FOREIGN BODY EXTREMITY;  Surgeon: Rosetta Posner, DPM;  Location: [facility];  Service: Podiatry;  Laterality: Left;   pelvic sling     SHOULDER ARTHROSCOPY WITH OPEN ROTATOR CUFF REPAIR Right 09/15/2015   Procedure: SHOULDER ARTHROSCOPY WITH OPEN ROTATOR CUFF REPAIR;  Surgeon: Christena Flake, MD;  Location: [facility];  Service: Orthopedics;  Laterality: Right;   SUTURE REMOVAL Left 07/03/2017  Procedure: REMOVAL OF RETAINED SUTURE FROM LEFT WRIST;  Surgeon: Christena Flake, MD;  Location: [facility];  Service: Orthopedics;  Laterality: Left;   TONSILLECTOMY     TUBAL LIGATION     WRIST ARTHROSCOPY WITH FOVEAL TRIANGULAR FIBROCARTILAGE COMPLEX REPAIR Left 03/28/2017   Procedure: WRIST ARTHROSCOPY WITH DEBRIDEMENT AND REPAIR OF TFCC TEAR;  Surgeon: Christena Flake, MD;  Location: [facility];  Service: Orthopedics;  Laterality: Left;    Prior to  Admission medications   Medication Sig Start Date End Date Taking? Authorizing Provider  gabapentin (NEURONTIN) 300 MG capsule Take 300 mg by mouth 3 (three) times daily. 05/23/22  Yes [provider]  cefdinir (OMNICEF) 300 MG capsule Take 300 mg by mouth 2 (two) times daily.    [provider]  citalopram (CELEXA) 20 MG tablet Take 20 mg by mouth daily. In am.    [provider]  simvastatin (ZOCOR) 20 MG tablet Take 20 mg by mouth daily at 6 PM.     [provider]    Family History  Problem Relation Age of Onset   Breast cancer Maternal Aunt    Breast cancer Paternal Grandmother      Social History   Tobacco Use   Smoking status: Never   Smokeless tobacco: Never  Vaping Use   Vaping status: Never Used  Substance Use Topics   Alcohol use: No   Drug use: No    Allergies as of 11/11/2022   (No Known Allergies)    Review of Systems:    All systems reviewed and negative except where noted in HPI.   Physical Exam:  Vital signs in last 24 hours: Temp:  [97.7 F (36.5 C)-97.9 F (36.6 C)] 97.7 F (36.5 C) (08/18 1120) Pulse Rate:  [56-67] 61 (08/18 1120) Resp:  [12-20] 16 (08/18 1120) BP: (129-182)/(69-98) 157/69 (08/18 1120) SpO2:  [94 %-100 %] 99 % (08/18 1120) Weight:  [57.8 kg] 57.8 kg (08/18 1130)   General:   Pleasant, cooperative in NAD Head:  Normocephalic and atraumatic. Eyes:   No icterus.   Conjunctiva pink. PERRLA. Ears:  Normal auditory acuity. Neck:  Supple; no masses or thyroidomegaly Lungs: Respirations even and unlabored. Lungs clear to auscultation bilaterally.   No wheezes, crackles, or rhonchi.  Heart:  Regular rate and rhythm;  Without murmur, clicks, rubs or gallops Abdomen:  Soft, nondistended, epigastric tenderness, Normal bowel sounds. No appreciable masses or hepatomegaly.  No rebound or guarding.  Neurologic:  Alert and oriented x3;  grossly normal neurologically. Skin:  Intact without significant lesions  or rashes. Cervical Nodes:  No significant cervical adenopathy. Psych:  Alert and cooperative. Normal affect.  LAB RESULTS: Recent Labs    11/11/22 0143  WBC 7.3  HGB 14.4  HCT 42.9  PLT 208   BMET Recent Labs    11/11/22 0143  NA 138  K 4.0  CL 105  CO2 23  GLUCOSE 118*  BUN 19  CREATININE 0.99  CALCIUM 9.0   LFT Recent Labs    11/11/22 0143  PROT 6.7  ALBUMIN 4.2  AST 18  ALT 17  ALKPHOS 49  BILITOT 1.5*  BILIDIR 0.2  IBILI 1.3*   PT/INR No results for input(s): "LABPROT", "INR" in the last 72 hours.  STUDIES: US ABDOMEN LIMITED RUQ (LIVER/GB)  Result Date: 11/11/2022 CLINICAL DATA:  Epigastric pain.  Pancreatitis. EXAM: ULTRASOUND ABDOMEN LIMITED RIGHT UPPER QUADRANT COMPARISON:  CT scan earlier same day FINDINGS: Gallbladder: Single 11 mm  gallstone is identified. No gallbladder polyp evident. No gallbladder wall thickening. No sonographic Murphy sign. Common bile duct: Diameter: 5-6 mm Liver: No focal lesion identified. Within normal limits in parenchymal echogenicity. Portal vein is patent on color Doppler imaging with normal direction of blood flow towards the liver. Other: None. IMPRESSION: Cholelithiasis without gallbladder wall thickening or sonographic Murphy sign. Electronically Signed   By: Kennith Center M.D.   On: 11/11/2022 09:22   CT ABDOMEN PELVIS W CONTRAST  Result Date: 11/11/2022 CLINICAL DATA:  Pancreatitis. EXAM: CT ABDOMEN AND PELVIS WITH CONTRAST TECHNIQUE: Multidetector CT imaging of the abdomen and pelvis was performed using the standard protocol following bolus administration of intravenous contrast. RADIATION DOSE REDUCTION: This exam was performed according to the departmental dose-optimization program which includes automated exposure control, adjustment of the mA and/or kV according to patient size and/or use of iterative reconstruction technique. CONTRAST:  OMNIPAQUE IOHEXOL 300 MG/ML  SOLN COMPARISON:  None Available. FINDINGS:  Lower chest: Dependent atelectasis in the lung bases. Hepatobiliary: No suspicious focal abnormality within the liver parenchyma. 6 mm focal hyperattenuating lesion in the gallbladder is probably a stone although gallbladder polyp not excluded. No intrahepatic or extrahepatic biliary dilation. Pancreas: No focal mass lesion. No dilatation of the main duct. No intraparenchymal cyst. No peripancreatic edema. Spleen: No splenomegaly. No suspicious focal mass lesion. Adrenals/Urinary Tract: No adrenal nodule or mass. Kidneys unremarkable. No evidence for hydroureter. The urinary bladder appears normal for the degree of distention. Stomach/Bowel: Focal soft tissue thickening noted posterior gastric fundus with an exophytic 9 mm soft tissue component (axial 21/2, sagittal 53/6 and coronal 45/5). This may be a tiny fundal diverticulum without fluid or gas. Mural nodule in the posterior stomach not excluded. Duodenum is normally positioned as is the ligament of Treitz. No small bowel wall thickening. No small bowel dilatation. The terminal ileum is normal. The appendix is normal. No gross colonic mass. No colonic wall thickening. Diverticular changes are noted in the left colon without evidence of diverticulitis. Vascular/Lymphatic: There is mild atherosclerotic calcification of the abdominal aorta without aneurysm. There is no gastrohepatic or hepatoduodenal ligament lymphadenopathy. No retroperitoneal or mesenteric lymphadenopathy. No pelvic sidewall lymphadenopathy. Reproductive: Hysterectomy.  There is no adnexal mass. Other: No intraperitoneal free fluid. Musculoskeletal: Tiny umbilical hernia contains only fat. No worrisome lytic or sclerotic osseous abnormality. 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 IMPRESSION: 1. No acute findings in the abdomen or pelvis. Specifically, no CT findings to suggest acute pancreatitis. 2. Focal soft tissue thickening posterior gastric fundus with an exophytic 9 mm soft  tissue component. This may be a tiny fundal diverticulum without fluid or gas. Mural nodule with exophytic component in the posterior stomach not excluded. GI consultation recommended. 3. 6 mm focal hyperattenuating lesion in the gallbladder is probably a stone although gallbladder polyp not excluded. Right upper quadrant ultrasound recommended to further evaluate. 4. Left colonic diverticulosis without diverticulitis. 5.  Aortic Atherosclerosis (ICD10-I70.0). Electronically Signed   By: Kennith Center M.D.   On: 11/11/2022 06:58   DG Chest Port 1 View  Result Date: 11/11/2022 CLINICAL DATA:  Chest pain under the left breast radiating to the left shoulder EXAM: PORTABLE CHEST 1 VIEW COMPARISON:  Radiographs 07/11/2021 FINDINGS: Stable cardiomediastinal silhouette. Aortic atherosclerotic calcification. Similar left basilar atelectasis or scarring. Remote left rib fractures. No pleural effusion or pneumothorax. IMPRESSION: No active disease. Electronically Signed   By: Minerva Fester M.D.   On: 11/11/2022 03:28      Impression /  Plan:   KATIRIA ORIOL is a 77 y.o. y/o female admitted with a short 1 day history of upper abdominal pain.  CT scan and right upper quadrant ultrasound did not show any features of biliary dilation.  There is a single solitary gallstone in the gallbladder with no gallbladder wall thickening.  Pancreas does not appear inflamed to suggest pancreatitis although pancreatic lipase is elevated over 2 and half times upper limit of normal.  Transaminases are not elevated.  No new medications or existing medications that are associated with pancreatitis.  The elevated pancreatic lipase could be related to gastritis or this may be early pancreatitis with a CT scan not yet showing features of inflammation.  Incidentally an exophytic lesion was seen in the gastric fundus which requires further evaluation.  Plan 1.  Continue IV fluids goal-directed therapy Ringer lactate would be the  fluid of choice along with analgesia.  IV PPI.  This would empirically treat any pancreatitis as well as gastritis.  2.  EGD day after tomorrow to evaluate exophytic lesion in the stomach. Endo list is full for tomorrow . Will start her on clears today and can advance diet tomorrow if ok . Will place orders for EGD tomorrow.   3.  If stomach appears completely normal then this could be pancreatitis of unknown etiology and would require an MRI of the pancreas in about 6 weeks time to rule out any nodules or lesions which could have precipitated the same  Thank you for involving me in the care of this patient.      LOS: 0 days   [name], MD  11/11/2022, 12:30 PM

## 2022-11-11 NOTE — Assessment & Plan Note (Addendum)
CT imaging w/ soft tissue thickening posterior gastric fundus with an exophytic 9 mm soft tissue component, mural nodule with exophytic component in the posterior stomach not excluded Dr. Okey Dupre with gastroenterology consulted with plan for EGD

## 2022-11-11 NOTE — H&P (Addendum)
History and Physical    Patient: Sabrina Burns DOB: [birth year] DOA: 11/11/2022 DOS: the patient was seen and examined on 11/11/2022 PCP: Mick Sell, MD  Patient coming from: Home  Chief Complaint:  Chief Complaint  Patient presents with   Chest Pain   HPI: Sabrina Burns is a 77 y.o. female with medical history significant of hyperlipidemia, depression, osteoporosis, mitral valve prolapse presenting with abdominal pain, gastric lesion, cholelithiasis.  Patient reports sudden onset of worsening generalized abdominal pain over the past 12 to 24 hours.  Pain has been recurring in nature more significantly over this timeframe.  Abdominally predominantly in the upper abdomen with radiation to either side.  Pain 10 out of 10 at times.  Positive nausea.  No chest pain or shortness of breath reported.  No diarrhea.  Patient denies any episodes like this in the past.  Patient denies any alcohol or tobacco use.  Baseline hyperlipidemia which is fairly stable. Presented to the ER afebrile, hemodynamically stable.  Satting well on room air.  White count 7.3, hemoglobin 14, platelets 208, creatinine 0.99, glucose 118.  Troponin negative x 2.  T. bili 1.5.  LFT stable.  Lipase 466.  CT of the abdomen pelvis negative for pancreatitis.  Noted gastric fundic lesion measured 9 mm with concern for mural nodule with exophytic component in the posterior stomach.  Right upper quadrant ultrasound positive for cholelithiasis without cholecystitis.  Chest x-ray within normal limits.  EKG normal sinus rhythm nonspecific ST-T wave changes. Review of Systems: As mentioned in the history of present illness. All other systems reviewed and are negative. Past Medical History:  Diagnosis Date   Cancer (HCC) 02/2017   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   Complication of anesthesia    hypotension  with bladder surgery, hard to wake up   Depression    H/O: rheumatic fever    HLD (hyperlipidemia)  11/11/2022   Hypercholesteremia unkn   Mitral valve prolapse    Osteoporosis    Past Surgical History:  Procedure Laterality Date   ABDOMINAL HYSTERECTOMY     BACK SURGERY     lumbar   CATARACT EXTRACTION W/PHACO Right 11/12/2018   Procedure: CATARACT EXTRACTION PHACO AND INTRAOCULAR LENS PLACEMENT (IOC) RIGHT;  Surgeon: Lockie Mola, MD;  Location: [facility];  Service: Ophthalmology;  Laterality: Right;   CATARACT EXTRACTION W/PHACO Left 12/03/2018   Procedure: CATARACT EXTRACTION PHACO AND INTRAOCULAR LENS PLACEMENT (IOC)  LEFT;  Surgeon: Lockie Mola, MD;  Location: [facility];  Service: Ophthalmology;  Laterality: Left;  Total Time: 00:57 Total Equivalent Power: 12.3% Cumulative Dissipated Energy: 7.05   COLONOSCOPY     FOOT SURGERY Right    FOREIGN BODY REMOVAL Left 01/19/2021   Procedure: REMOVAL FOREIGN BODY EXTREMITY;  Surgeon: Rosetta Posner, DPM;  Location: [facility];  Service: Podiatry;  Laterality: Left;   pelvic sling     SHOULDER ARTHROSCOPY WITH OPEN ROTATOR CUFF REPAIR Right 09/15/2015   Procedure: SHOULDER ARTHROSCOPY WITH OPEN ROTATOR CUFF REPAIR;  Surgeon: Christena Flake, MD;  Location: [facility];  Service: Orthopedics;  Laterality: Right;   SUTURE REMOVAL Left 07/03/2017   Procedure: REMOVAL OF RETAINED SUTURE FROM LEFT WRIST;  Surgeon: Christena Flake, MD;  Location: [facility];  Service: Orthopedics;  Laterality: Left;   TONSILLECTOMY     TUBAL LIGATION     WRIST ARTHROSCOPY WITH FOVEAL TRIANGULAR FIBROCARTILAGE COMPLEX REPAIR Left 03/28/2017   Procedure: WRIST ARTHROSCOPY WITH DEBRIDEMENT AND REPAIR OF  TFCC TEAR;  Surgeon: Christena Flake, MD;  Location: [facility];  Service: Orthopedics;  Laterality: Left;   Social History:  reports that she has never smoked. She has never used smokeless tobacco. She reports that she does not drink alcohol and does not use drugs.  No Known Allergies  Family History  Problem Relation Age of  Onset   Breast cancer Maternal Aunt    Breast cancer Paternal Grandmother     Prior to Admission medications   Medication Sig Start Date End Date Taking? Authorizing Provider  gabapentin (NEURONTIN) 300 MG capsule Take 300 mg by mouth 3 (three) times daily. 05/23/22  Yes [provider]  cefdinir (OMNICEF) 300 MG capsule Take 300 mg by mouth 2 (two) times daily.    [provider]  citalopram (CELEXA) 20 MG tablet Take 20 mg by mouth daily. In am.    [provider]  simvastatin (ZOCOR) 20 MG tablet Take 20 mg by mouth daily at 6 PM.     [provider]    Physical Exam: Vitals:   11/11/22 0642 11/11/22 0700 11/11/22 0800 11/11/22 1026  BP: (!) 182/89 (!) 167/81 (!) 153/81 (!) 146/70  Pulse: 65 66 (!) 59 (!) 56  Resp: 16 19 12 14   Temp:    97.8 F (36.6 C)  TempSrc:    Oral  SpO2: 100% 98% 94% 95%   Physical Exam Constitutional:      Appearance: She is normal weight.  HENT:     Head: Normocephalic and atraumatic.     Nose: Nose normal.     Mouth/Throat:     Mouth: Mucous membranes are moist.  Cardiovascular:     Rate and Rhythm: Normal rate and regular rhythm.  Pulmonary:     Effort: Pulmonary effort is normal.  Abdominal:     General: Bowel sounds are normal.     Comments: Mild generalized abdominal pain Positive bowel sounds  Musculoskeletal:     Cervical back: Normal range of motion.  Skin:    General: Skin is warm.  Neurological:     General: No focal deficit present.  Psychiatric:        Mood and Affect: Mood normal.     Data Reviewed:  There are no new results to review at this time. US ABDOMEN LIMITED RUQ (LIVER/GB) CLINICAL DATA:  Epigastric pain.  Pancreatitis.  EXAM: ULTRASOUND ABDOMEN LIMITED RIGHT UPPER QUADRANT  COMPARISON:  CT scan earlier same day  FINDINGS: Gallbladder:  Single 11 mm gallstone is identified. No gallbladder polyp evident. No gallbladder wall thickening. No sonographic Murphy  sign.  Common bile duct:  Diameter: 5-6 mm  Liver:  No focal lesion identified. Within normal limits in parenchymal echogenicity. Portal vein is patent on color Doppler imaging with normal direction of blood flow towards the liver.  Other: None.  IMPRESSION: Cholelithiasis without gallbladder wall thickening or sonographic Murphy sign.  Electronically Signed   By: Kennith Center M.D.   On: 11/11/2022 09:22 CT ABDOMEN PELVIS W CONTRAST CLINICAL DATA:  Pancreatitis.  EXAM: CT ABDOMEN AND PELVIS WITH CONTRAST  TECHNIQUE: Multidetector CT imaging of the abdomen and pelvis was performed using the standard protocol following bolus administration of intravenous contrast.  RADIATION DOSE REDUCTION: This exam was performed according to the departmental dose-optimization program which includes automated exposure control, adjustment of the mA and/or kV according to patient size and/or use of iterative reconstruction technique.  CONTRAST:  OMNIPAQUE IOHEXOL 300 MG/ML  SOLN  COMPARISON:  None Available.  FINDINGS: Lower chest: Dependent atelectasis in the lung bases.  Hepatobiliary: No suspicious focal abnormality within the liver parenchyma. 6 mm focal hyperattenuating lesion in the gallbladder is probably a stone although gallbladder polyp not excluded. No intrahepatic or extrahepatic biliary dilation.  Pancreas: No focal mass lesion. No dilatation of the main duct. No intraparenchymal cyst. No peripancreatic edema.  Spleen: No splenomegaly. No suspicious focal mass lesion.  Adrenals/Urinary Tract: No adrenal nodule or mass. Kidneys unremarkable. No evidence for hydroureter. The urinary bladder appears normal for the degree of distention.  Stomach/Bowel: Focal soft tissue thickening noted posterior gastric fundus with an exophytic 9 mm soft tissue component (axial 21/2, sagittal 53/6 and coronal 45/5). This may be a tiny fundal diverticulum without fluid or gas.  Mural nodule in the posterior stomach not excluded. Duodenum is normally positioned as is the ligament of Treitz. No small bowel wall thickening. No small bowel dilatation. The terminal ileum is normal. The appendix is normal. No gross colonic mass. No colonic wall thickening. Diverticular changes are noted in the left colon without evidence of diverticulitis.  Vascular/Lymphatic: There is mild atherosclerotic calcification of the abdominal aorta without aneurysm. There is no gastrohepatic or hepatoduodenal ligament lymphadenopathy. No retroperitoneal or mesenteric lymphadenopathy. No pelvic sidewall lymphadenopathy.  Reproductive: Hysterectomy.  There is no adnexal mass.  Other: No intraperitoneal free fluid.  Musculoskeletal: Tiny umbilical hernia contains only fat. No worrisome lytic or sclerotic osseous abnormality. 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  IMPRESSION: 1. No acute findings in the abdomen or pelvis. Specifically, no CT findings to suggest acute pancreatitis. 2. Focal soft tissue thickening posterior gastric fundus with an exophytic 9 mm soft tissue component. This may be a tiny fundal diverticulum without fluid or gas. Mural nodule with exophytic component in the posterior stomach not excluded. GI consultation recommended. 3. 6 mm focal hyperattenuating lesion in the gallbladder is probably a stone although gallbladder polyp not excluded. Right upper quadrant ultrasound recommended to further evaluate. 4. Left colonic diverticulosis without diverticulitis. 5.  Aortic Atherosclerosis (ICD10-I70.0).  Electronically Signed   By: Kennith Center M.D.   On: 11/11/2022 06:58 DG Chest Port 1 View CLINICAL DATA:  Chest pain under the left breast radiating to the left shoulder  EXAM: PORTABLE CHEST 1 VIEW  COMPARISON:  Radiographs 07/11/2021  FINDINGS: Stable cardiomediastinal silhouette. Aortic atherosclerotic calcification. Similar left basilar  atelectasis or scarring. Remote left rib fractures. No pleural effusion or pneumothorax.  IMPRESSION: No active disease.  Electronically Signed   By: Minerva Fester M.D.   On: 11/11/2022 03:28  Lab Results  Component Value Date   WBC 7.3 11/11/2022   HGB 14.4 11/11/2022   HCT 42.9 11/11/2022   MCV 93.3 11/11/2022   PLT 208 11/11/2022   Lab Results  Component Value Date   ALT 17 11/11/2022   AST 18 11/11/2022   ALKPHOS 49 11/11/2022   BILITOT 1.5 (H) 11/11/2022    Assessment and Plan: Abdominal pain Positive progressive onset of generalized abdominal pain over the past 24 hours Lipase 466 No overt pancreatitis on imaging though with gastric lesion as well as cholelithiasis without Cholecystitis LFTs WNL  Case discussed with Dr. Tobi Bastos with gastroenterology Predominant concern for gastritis IV PPI Pain control Antiemetics IV fluid hydration Follow-up formal gastroenterology recommendations  Gastric lesion CT imaging w/ soft tissue thickening posterior gastric fundus with an exophytic 9 mm soft tissue component, mural nodule with exophytic component in the posterior stomach not excluded Dr. Okey Dupre with  gastroenterology consulted with plan for EGD    Cholelithiasis Noted Cholelithiasis without gallbladder wall thickening or sonographic Murphy sign on imaging  No significant RUQ abd pain on exam  Monitor   Multiple thyroid nodules Patient with history of thyroid nodules. Negative FNA 05/20/2020  Previously evaluated in the [facility] system Monitor  Depression Cont celexa   HLD (hyperlipidemia) Cont statin        Advance Care Planning:   Code Status: Full Code   Consults: Dr. Tobi Bastos w/ GI   Family Communication: No family at the bedside   Severity of Illness: The appropriate patient status for this patient is OBSERVATION. Observation status is judged to be reasonable and necessary in order to provide the required intensity of service to ensure the patient's  safety. The patient's presenting symptoms, physical exam findings, and initial radiographic and laboratory data in the context of their medical condition is felt to place them at decreased risk for further clinical deterioration. Furthermore, it is anticipated that the patient will be medically stable for discharge from the hospital within 2 midnights of admission.   Author: Floydene Flock, MD 11/11/2022 11:01 AM  For on call review [email or web address].

## 2022-11-11 NOTE — ED Triage Notes (Signed)
Patient reports pain under the left breast that radiates into the left shoulder/axilla and back. Associated with nausea, intermittent lightheadedness. Started around 2000 while watching TV. Pain has become worse. Took 2 tums and gas x without relief.

## 2022-11-12 DIAGNOSIS — K851 Biliary acute pancreatitis without necrosis or infection: Secondary | ICD-10-CM

## 2022-11-12 DIAGNOSIS — M810 Age-related osteoporosis without current pathological fracture: Secondary | ICD-10-CM | POA: Diagnosis present

## 2022-11-12 DIAGNOSIS — Z85828 Personal history of other malignant neoplasm of skin: Secondary | ICD-10-CM | POA: Diagnosis not present

## 2022-11-12 DIAGNOSIS — K5730 Diverticulosis of large intestine without perforation or abscess without bleeding: Secondary | ICD-10-CM | POA: Diagnosis present

## 2022-11-12 DIAGNOSIS — E042 Nontoxic multinodular goiter: Secondary | ICD-10-CM | POA: Diagnosis present

## 2022-11-12 DIAGNOSIS — Z803 Family history of malignant neoplasm of breast: Secondary | ICD-10-CM | POA: Diagnosis not present

## 2022-11-12 DIAGNOSIS — F32A Depression, unspecified: Secondary | ICD-10-CM | POA: Diagnosis present

## 2022-11-12 DIAGNOSIS — K859 Acute pancreatitis without necrosis or infection, unspecified: Secondary | ICD-10-CM | POA: Diagnosis present

## 2022-11-12 DIAGNOSIS — Z90710 Acquired absence of both cervix and uterus: Secondary | ICD-10-CM | POA: Diagnosis not present

## 2022-11-12 DIAGNOSIS — E872 Acidosis, unspecified: Secondary | ICD-10-CM | POA: Diagnosis present

## 2022-11-12 DIAGNOSIS — K8020 Calculus of gallbladder without cholecystitis without obstruction: Secondary | ICD-10-CM | POA: Diagnosis present

## 2022-11-12 DIAGNOSIS — Z79899 Other long term (current) drug therapy: Secondary | ICD-10-CM | POA: Diagnosis not present

## 2022-11-12 DIAGNOSIS — E7800 Pure hypercholesterolemia, unspecified: Secondary | ICD-10-CM | POA: Diagnosis present

## 2022-11-12 DIAGNOSIS — R933 Abnormal findings on diagnostic imaging of other parts of digestive tract: Secondary | ICD-10-CM | POA: Diagnosis not present

## 2022-11-12 LAB — COMPREHENSIVE METABOLIC PANEL
ALT: 13 U/L (ref 0–44)
AST: 14 U/L — ABNORMAL LOW (ref 15–41)
Albumin: 3.5 g/dL (ref 3.5–5.0)
Alkaline Phosphatase: 45 U/L (ref 38–126)
Anion gap: 6 (ref 5–15)
BUN: 9 mg/dL (ref 8–23)
CO2: 21 mmol/L — ABNORMAL LOW (ref 22–32)
Calcium: 7.5 mg/dL — ABNORMAL LOW (ref 8.9–10.3)
Chloride: 110 mmol/L (ref 98–111)
Creatinine, Ser: 0.73 mg/dL (ref 0.44–1.00)
GFR, Estimated: >60 mL/min (ref >60)
Glucose, Bld: 106 mg/dL — ABNORMAL HIGH (ref 70–99)
Potassium: 3.9 mmol/L (ref 3.5–5.1)
Sodium: 137 mmol/L (ref 135–145)
Total Bilirubin: 2.5 mg/dL — ABNORMAL HIGH (ref 0.3–1.2)
Total Protein: 5.5 g/dL — ABNORMAL LOW (ref 6.5–8.1)

## 2022-11-12 LAB — CBC
HCT: 36 % (ref 36.0–46.0)
Hemoglobin: 12.2 g/dL (ref 12.0–15.0)
MCH: 31.4 pg (ref 26.0–34.0)
MCHC: 33.9 g/dL (ref 30.0–36.0)
MCV: 92.8 fL (ref 80.0–100.0)
Platelets: 166 10*3/uL (ref 150–400)
RBC: 3.88 MIL/uL (ref 3.87–5.11)
RDW: 12.1 % (ref 11.5–15.5)
WBC: 6.3 10*3/uL (ref 4.0–10.5)
nRBC: 0 % (ref 0.0–0.2)

## 2022-11-12 LAB — LIPASE, BLOOD: Lipase: 109 U/L — ABNORMAL HIGH (ref 11–51)

## 2022-11-12 LAB — MAGNESIUM: Magnesium: 2.2 mg/dL (ref 1.7–2.4)

## 2022-11-12 LAB — TRIGLYCERIDES: Triglycerides: 100 mg/dL (ref <150)

## 2022-11-12 MED ORDER — ONDANSETRON HCL 4 MG/2ML IJ SOLN
4.0000 mg | Freq: Four times a day (QID) | INTRAMUSCULAR | Status: DC | PRN
Start: 2022-11-12 — End: 2022-11-12

## 2022-11-12 MED ORDER — ONDANSETRON HCL 4 MG PO TABS: 4.0000 mg | ORAL_TABLET | Freq: Four times a day (QID) | ORAL | Status: DC | PRN

## 2022-11-12 MED ORDER — OXYCODONE-ACETAMINOPHEN 5-325 MG PO TABS
1.0000 | ORAL_TABLET | ORAL | Status: DC | PRN
  Administered 2022-11-12 (×2): 1 via ORAL
  Filled 2022-11-12 (×2): qty 1

## 2022-11-12 MED ORDER — LACTATED RINGERS IV SOLN: INTRAVENOUS | Status: DC

## 2022-11-12 MED ORDER — SODIUM CHLORIDE 0.9 % IV SOLN
12.5000 mg | Freq: Four times a day (QID) | INTRAVENOUS | Status: DC | PRN
  Administered 2022-11-12: 12.5 mg via INTRAVENOUS
  Filled 2022-11-12: qty 0.5

## 2022-11-12 MED ORDER — ONDANSETRON HCL 4 MG/2ML IJ SOLN: 4.0000 mg | Freq: Four times a day (QID) | INTRAMUSCULAR | Status: DC | PRN

## 2022-11-12 NOTE — Progress Notes (Signed)
  Progress Note   Patient: Sabrina Burns ZOX:[id number] DOB: Nov 02, 1945 DOA: 11/11/2022     0 DOS: the patient was seen and examined on 11/12/2022   Brief hospital course: Sabrina Burns is a 77 y.o. female with medical history significant of hyperlipidemia, depression, osteoporosis, mitral valve prolapse presenting with abdominal pain, gastric lesion, cholelithiasis.  Patient reports sudden onset of worsening generalized abdominal pain over the past 12 to 24 hours.  Right upper quadrant ultrasound showed cholelithiasis without cholecystitis, lipase elevated at 466. Patient is seen by GI, condition consistent with gallstone pancreatitis.  EGD scheduled for 8/20.   Principal Problem:   Gallstone pancreatitis Active Problems:   Abdominal pain   Gastric lesion   Cholelithiasis   HLD (hyperlipidemia)   Depression   Multiple thyroid nodules   Abnormal CT scan, gastrointestinal tract   Acute gallstone pancreatitis   Metabolic acidosis   Assessment and Plan:  Acute gallstone pancreatitis. Positive progressive onset of generalized abdominal pain over the past 24 hours Lipase 466 Seen by GI, consider possibility of gallstone pancreatitis.  Patient received IV fluids, lipase has normalized. I will continue some IV fluids today, pending workup with GI.  Gastric lesion CT imaging w/ soft tissue thickening posterior gastric fundus with an exophytic 9 mm soft tissue component, mural nodule with exophytic component in the posterior stomach not excluded EGD scheduled by GI.    Cholelithiasis No evidence of cholecystitis.  Follow-up with general surgery as outpatient.  Multiple thyroid nodules Patient with history of thyroid nodules. Negative FNA 05/20/2020  Previously evaluated in the [facility] system   Depression Cont celexa   HLD (hyperlipidemia) Cont statin        Subjective:  Patient still has some upper stomach pain, without nausea vomiting today.  Physical  Exam: Vitals:   11/11/22 2110 11/12/22 0404 11/12/22 0847 11/12/22 1556  BP: 135/60 125/62 130/60 118/61  Pulse: 65 68 70 64  Resp: 18 20 18 16   Temp: 97.8 F (36.6 C) 97.7 F (36.5 C) 97.8 F (36.6 C) 98.2 F (36.8 C)  TempSrc: Oral   Oral  SpO2: 96% 91% 91% 98%  Weight:      Height:       General exam: Appears calm and comfortable  Respiratory system: Clear to auscultation. Respiratory effort normal. Cardiovascular system: S1 & S2 heard, RRR. No JVD, murmurs, rubs, gallops or clicks. No pedal edema. Gastrointestinal system: Abdomen is nondistended, soft and epigastric tender. No organomegaly or masses felt. Normal bowel sounds heard. Central nervous system: Alert and oriented. No focal neurological deficits. Extremities: Symmetric 5 x 5 power. Skin: No rashes, lesions or ulcers Psychiatry: Judgement and insight appear normal. Mood & affect appropriate.     Data Reviewed:  CT scan lab results reviewed.  Family Communication: None  Disposition: Status is: Inpatient Remains inpatient appropriate because: Severity of disease, IV treatment.     Time spent: 35 minutes  Author: Marrion Coy, MD 11/12/2022 5:12 PM  For on call review [email or web address].

## 2022-11-12 NOTE — Plan of Care (Signed)
  Problem: Education: Goal: Knowledge of General Education information will improve Description: Including pain rating scale, medication(s)/side effects and non-pharmacologic comfort measures Outcome: Not Progressing   Problem: Health Behavior/Discharge Planning: Goal: Ability to manage health-related needs will improve Outcome: Not Progressing   Problem: Clinical Measurements: Goal: Ability to maintain clinical measurements within normal limits will improve Outcome: Not Progressing Goal: Will remain free from infection Outcome: Not Progressing Goal: Diagnostic test results will improve Outcome: Not Progressing Goal: Respiratory complications will improve Outcome: Not Progressing Goal: Cardiovascular complication will be avoided Outcome: Not Progressing   Problem: Activity: Goal: Risk for activity intolerance will decrease Outcome: Not Progressing   Problem: Nutrition: Goal: Adequate nutrition will be maintained Outcome: Not Progressing   Problem: Coping: Goal: Level of anxiety will decrease Outcome: Not Progressing   Problem: Elimination: Goal: Will not experience complications related to bowel motility Outcome: Not Progressing Goal: Will not experience complications related to urinary retention Outcome: Not Progressing   Problem: Pain Managment: Goal: General experience of comfort will improve Outcome: Not Progressing   Problem: Safety: Goal: Ability to remain free from injury will improve Outcome: Not Progressing   Problem: Skin Integrity: Goal: Risk for impaired skin integrity will decrease Outcome: Not Progressing   Problem: Education: Goal: Knowledge of General Education information will improve Description: Including pain rating scale, medication(s)/side effects and non-pharmacologic comfort measures Outcome: Not Progressing   Problem: Health Behavior/Discharge Planning: Goal: Ability to manage health-related needs will improve Outcome: Not  Progressing   Problem: Clinical Measurements: Goal: Ability to maintain clinical measurements within normal limits will improve Outcome: Not Progressing Goal: Will remain free from infection Outcome: Not Progressing Goal: Diagnostic test results will improve Outcome: Not Progressing Goal: Respiratory complications will improve Outcome: Not Progressing Goal: Cardiovascular complication will be avoided Outcome: Not Progressing   Problem: Activity: Goal: Risk for activity intolerance will decrease Outcome: Not Progressing   Problem: Nutrition: Goal: Adequate nutrition will be maintained Outcome: Not Progressing   Problem: Coping: Goal: Level of anxiety will decrease Outcome: Not Progressing   Problem: Elimination: Goal: Will not experience complications related to bowel motility Outcome: Not Progressing Goal: Will not experience complications related to urinary retention Outcome: Not Progressing   Problem: Pain Managment: Goal: General experience of comfort will improve Outcome: Not Progressing   Problem: Safety: Goal: Ability to remain free from injury will improve Outcome: Not Progressing   Problem: Skin Integrity: Goal: Risk for impaired skin integrity will decrease Outcome: Not Progressing   

## 2022-11-12 NOTE — TOC CM/SW Note (Signed)
Transition of Care [facility]) - Inpatient Brief Assessment   Patient Details  Name: Sabrina Burns MRN: [id number] Date of Birth: 03-01-1946  Transition of Care [facility]) CM/SW Contact:    Chapman Fitch, RN Phone Number: 11/12/2022, 9:54 AM   Clinical Narrative:   Transition of Care [facility]) Screening Note   Patient Details  Name: Sabrina Burns Date of Birth: [birth year]   Transition of Care [facility]) CM/SW Contact:    Chapman Fitch, RN Phone Number: 11/12/2022, 9:54 AM    Transition of Care Department [facility]) has reviewed patient and no TOC needs have been identified at this time. We will continue to monitor patient advancement through interdisciplinary progression rounds. If new patient transition needs arise, please place a TOC consult.    Transition of Care Asessment: Insurance and Status: Insurance coverage has been reviewed Patient has primary care physician: Yes     Prior/Current Home Services: No current home services Social Determinants of Health Reivew: SDOH reviewed no interventions necessary Readmission risk has been reviewed: Yes Transition of care needs: no transition of care needs at this time

## 2022-11-12 NOTE — Hospital Course (Signed)
Sabrina Burns is a 77 y.o. female with medical history significant of hyperlipidemia, depression, osteoporosis, mitral valve prolapse presenting with abdominal pain, gastric lesion, cholelithiasis.  Patient reports sudden onset of worsening generalized abdominal pain over the past 12 to 24 hours.  Right upper quadrant ultrasound showed cholelithiasis without cholecystitis, lipase elevated at 466. Patient is seen by GI, condition consistent with gallstone pancreatitis.  EGD scheduled for 8/20.

## 2022-11-13 ENCOUNTER — Encounter
Admission: EM | Disposition: A | Discharge status: Home / Self Care | Payer: Self-pay | Source: Home / Self Care | Attending: Internal Medicine

## 2022-11-13 ENCOUNTER — Encounter: Payer: Self-pay | Admitting: Internal Medicine

## 2022-11-13 ENCOUNTER — Inpatient Hospital Stay: Payer: Medicare HMO | Admitting: Anesthesiology

## 2022-11-13 DIAGNOSIS — K851 Biliary acute pancreatitis without necrosis or infection: Secondary | ICD-10-CM | POA: Diagnosis not present

## 2022-11-13 DIAGNOSIS — R933 Abnormal findings on diagnostic imaging of other parts of digestive tract: Secondary | ICD-10-CM | POA: Diagnosis not present

## 2022-11-13 DIAGNOSIS — E872 Acidosis, unspecified: Secondary | ICD-10-CM

## 2022-11-13 HISTORY — PX: ESOPHAGOGASTRODUODENOSCOPY (EGD) WITH PROPOFOL: SHX5813

## 2022-11-13 LAB — BASIC METABOLIC PANEL
Anion gap: 5 (ref 5–15)
BUN: 6 mg/dL — ABNORMAL LOW (ref 8–23)
CO2: 21 mmol/L — ABNORMAL LOW (ref 22–32)
Calcium: 8.1 mg/dL — ABNORMAL LOW (ref 8.9–10.3)
Chloride: 114 mmol/L — ABNORMAL HIGH (ref 98–111)
Creatinine, Ser: 0.65 mg/dL (ref 0.44–1.00)
GFR, Estimated: >60 mL/min (ref >60)
Glucose, Bld: 93 mg/dL (ref 70–99)
Potassium: 4 mmol/L (ref 3.5–5.1)
Sodium: 140 mmol/L (ref 135–145)

## 2022-11-13 LAB — MAGNESIUM: Magnesium: 2.2 mg/dL (ref 1.7–2.4)

## 2022-11-13 SURGERY — ESOPHAGOGASTRODUODENOSCOPY (EGD) WITH PROPOFOL
Anesthesia: General

## 2022-11-13 MED ORDER — GLYCOPYRROLATE 0.2 MG/ML IJ SOLN
INTRAMUSCULAR | Status: DC | PRN
  Administered 2022-11-13: .2 mg via INTRAVENOUS

## 2022-11-13 MED ORDER — SODIUM BICARBONATE 650 MG PO TABS: 650.0000 mg | ORAL_TABLET | Freq: Two times a day (BID) | ORAL | 0 refills | Status: AC

## 2022-11-13 MED ORDER — PROPOFOL 10 MG/ML IV BOLUS
INTRAVENOUS | Status: DC | PRN
  Administered 2022-11-13: 90 mg via INTRAVENOUS

## 2022-11-13 MED ORDER — SODIUM CHLORIDE 0.9 % IV SOLN: INTRAVENOUS | Status: DC

## 2022-11-13 MED ORDER — LIDOCAINE HCL (CARDIAC) PF 100 MG/5ML IV SOSY
PREFILLED_SYRINGE | INTRAVENOUS | Status: DC | PRN
  Administered 2022-11-13: 80 mg via INTRAVENOUS

## 2022-11-13 MED ORDER — SODIUM BICARBONATE 650 MG PO TABS
650.0000 mg | ORAL_TABLET | Freq: Two times a day (BID) | ORAL | Status: DC
  Administered 2022-11-13: 650 mg via ORAL
  Filled 2022-11-13: qty 1

## 2022-11-13 NOTE — H&P (Signed)
[name], MD [address], [address], [place], [place], [address] [address], [address], [place], [place], [address] Phone: [phone]  Fax: [phone]  Primary Care Physician:  Mick Sell, MD   Pre-Procedure History & Physical: HPI:  Sabrina Burns is a 77 y.o. 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   Complication of anesthesia    hypotension  with bladder surgery, hard to wake up   Depression    H/O: rheumatic fever    HLD (hyperlipidemia) 11/11/2022   Hypercholesteremia unkn   Mitral valve prolapse    Osteoporosis     Past Surgical History:  Procedure Laterality Date   ABDOMINAL HYSTERECTOMY     BACK SURGERY     lumbar   CATARACT EXTRACTION W/PHACO Right 11/12/2018   Procedure: CATARACT EXTRACTION PHACO AND INTRAOCULAR LENS PLACEMENT (IOC) RIGHT;  Surgeon: Lockie Mola, MD;  Location: [facility];  Service: Ophthalmology;  Laterality: Right;   CATARACT EXTRACTION W/PHACO Left 12/03/2018   Procedure: CATARACT EXTRACTION PHACO AND INTRAOCULAR LENS PLACEMENT (IOC)  LEFT;  Surgeon: Lockie Mola, MD;  Location: [facility];  Service: Ophthalmology;  Laterality: Left;  Total Time: 00:57 Total Equivalent Power: 12.3% Cumulative Dissipated Energy: 7.05   COLONOSCOPY     FOOT SURGERY Right    FOREIGN BODY REMOVAL Left 01/19/2021   Procedure: REMOVAL FOREIGN BODY EXTREMITY;  Surgeon: Rosetta Posner, DPM;  Location: [facility];  Service: Podiatry;  Laterality: Left;   pelvic sling     SHOULDER ARTHROSCOPY WITH OPEN ROTATOR CUFF REPAIR Right 09/15/2015   Procedure: SHOULDER ARTHROSCOPY WITH OPEN ROTATOR CUFF REPAIR;  Surgeon: Christena Flake, MD;  Location: [facility];  Service: Orthopedics;  Laterality: Right;   SUTURE REMOVAL Left 07/03/2017   Procedure: REMOVAL OF RETAINED SUTURE FROM LEFT WRIST;  Surgeon: Christena Flake, MD;   Location: [facility];  Service: Orthopedics;  Laterality: Left;   TONSILLECTOMY     TUBAL LIGATION     WRIST ARTHROSCOPY WITH FOVEAL TRIANGULAR FIBROCARTILAGE COMPLEX REPAIR Left 03/28/2017   Procedure: WRIST ARTHROSCOPY WITH DEBRIDEMENT AND REPAIR OF TFCC TEAR;  Surgeon: Christena Flake, MD;  Location: [facility];  Service: Orthopedics;  Laterality: Left;    Prior to Admission medications   Medication Sig Start Date End Date Taking? Authorizing Provider  gabapentin (NEURONTIN) 300 MG capsule Take 300 mg by mouth 3 (three) times daily. 05/23/22  Yes [provider]  cefdinir (OMNICEF) 300 MG capsule Take 300 mg by mouth 2 (two) times daily.    [provider]  citalopram (CELEXA) 20 MG tablet Take 20 mg by mouth daily. In am.    [provider]  simvastatin (ZOCOR) 20 MG tablet Take 20 mg by mouth daily at 6 PM.     [provider]    Allergies as of 11/11/2022   (No Known Allergies)    Family History  Problem Relation Age of Onset   Breast cancer Maternal Aunt    Breast cancer Paternal Grandmother     Social History   Socioeconomic History   Marital status: Widowed    Spouse name: Not on file   Number of children: Not on file   Years of education: Not on file   Highest education level: Not on file  Occupational History   Not on file  Tobacco Use  Smoking status: Never   Smokeless tobacco: Never  Vaping Use   Vaping status: Never Used  Substance and Sexual Activity   Alcohol use: No   Drug use: No   Sexual activity: Not on file  Other Topics Concern   Not on file  Social History Narrative   Not on file   Social Determinants of Health   Financial Resource Strain: Low Risk  (11/05/2022)   Received from [facility]   Overall Financial Resource Strain (CARDIA)    Difficulty of Paying Living Expenses: Not hard at all  Food Insecurity: No Food Insecurity (11/11/2022)   Hunger Vital Sign    Worried About Running  Out of Food in the Last Year: Never true    Ran Out of Food in the Last Year: Never true  Transportation Needs: No Transportation Needs (11/11/2022)   PRAPARE - Administrator, Civil Service (Medical): No    Lack of Transportation (Non-Medical): No  Physical Activity: Not on file  Stress: Not on file  Social Connections: Not on file  Intimate Partner Violence: Not At Risk (11/11/2022)   Humiliation, Afraid, Rape, and Kick questionnaire    Fear of Current or Ex-Partner: No    Emotionally Abused: No    Physically Abused: No    Sexually Abused: No    Review of Systems: See HPI, otherwise negative ROS  Physical Exam: BP (!) 146/75 (BP Location: Right Arm)   Pulse 74   Temp 98.5 F (36.9 C) (Oral)   Resp 16   Ht 5\' 1"  (1.549 m)   Wt 57.8 kg   SpO2 93%   BMI 24.07 kg/m  General:   Alert,  pleasant and cooperative in NAD Head:  Normocephalic and atraumatic. Neck:  Supple; no masses or thyromegaly. Lungs:  Clear throughout to auscultation, normal respiratory effort.    Heart:  +S1, +S2, Regular rate and rhythm, No edema. Abdomen:  Soft, nontender and nondistended. Normal bowel sounds, without guarding, and without rebound.   Neurologic:  Alert and  oriented x4;  grossly normal neurologically.  Impression/Plan: Sabrina Burns is here for an endoscopy  to be performed for  evaluation of abnormal ct scan.     Risks, benefits, limitations, and alternatives regarding endoscopy have been reviewed with the patient.  Questions have been answered.  All parties agreeable.   [name], MD  11/13/2022, 10:21 AM

## 2022-11-13 NOTE — Progress Notes (Signed)
Sabrina Burns Locket to be D/C'd Home per MD order.  Discussed prescriptions and follow up appointments with the patient. Prescriptions given to patient, medication list explained in detail. Pt verbalized understanding.  Allergies as of 11/13/2022   No Known Allergies      Medication List     STOP taking these medications    cefdinir 300 MG capsule Commonly known as: OMNICEF       TAKE these medications    citalopram 20 MG tablet Commonly known as: CELEXA Take 20 mg by mouth daily. In am.   gabapentin 300 MG capsule Commonly known as: NEURONTIN Take 300 mg by mouth 3 (three) times daily.   simvastatin 20 MG tablet Commonly known as: ZOCOR Take 20 mg by mouth daily at 6 PM.   sodium bicarbonate 650 MG tablet Take 1 tablet (650 mg total) by mouth 2 (two) times daily for 7 days.        Vitals:   11/13/22 1129 11/13/22 1139  BP:    Pulse:  82  Resp: 18 16  Temp:    SpO2:  96%    Skin clean, dry and intact without evidence of skin break down, no evidence of skin tears noted. IV catheter discontinued intact. Site without signs and symptoms of complications. Dressing and pressure applied. Pt denies pain at this time. No complaints noted.  An After Visit Summary was printed and given to the patient. Patient escorted via WC, and D/C home via private auto.  Sabrina Burns Sabrina Burns

## 2022-11-13 NOTE — Anesthesia Preprocedure Evaluation (Signed)
Anesthesia Evaluation  Patient identified by MRN, date of birth, ID band Patient awake    Reviewed: Allergy & Precautions, NPO status , Patient's Chart, lab work & pertinent test results  History of Anesthesia Complications (+) history of anesthetic complications  Airway Mallampati: III  TM Distance: <3 FB Neck ROM: full    Dental  (+) Chipped   Pulmonary neg pulmonary ROS, neg shortness of breath   Pulmonary exam normal        Cardiovascular negative cardio ROS Normal cardiovascular exam     Neuro/Psych  PSYCHIATRIC DISORDERS      negative neurological ROS     GI/Hepatic negative GI ROS, Neg liver ROS,neg GERD  ,,  Endo/Other  negative endocrine ROS    Renal/GU negative Renal ROS  negative genitourinary   Musculoskeletal   Abdominal   Peds  Hematology negative hematology ROS (+)   Anesthesia Other Findings Past Medical History: 02/2017: Cancer (HCC)     Comment:  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 No date: Complication of anesthesia     Comment:  hypotension  with bladder surgery, hard to wake up No date: Depression No date: H/O: rheumatic fever 11/11/2022: HLD (hyperlipidemia) unkn: Hypercholesteremia No date: Mitral valve prolapse No date: Osteoporosis  Past Surgical History: No date: ABDOMINAL HYSTERECTOMY No date: BACK SURGERY     Comment:  lumbar 11/12/2018: CATARACT EXTRACTION W/PHACO; Right     Comment:  Procedure: CATARACT EXTRACTION PHACO AND INTRAOCULAR               LENS PLACEMENT (IOC) RIGHT;  Surgeon: Lockie Mola, MD;  Location: [facility];  Service:               Ophthalmology;  Laterality: Right; 12/03/2018: CATARACT EXTRACTION W/PHACO; Left     Comment:  Procedure: CATARACT EXTRACTION PHACO AND INTRAOCULAR               LENS PLACEMENT (IOC)  LEFT;  Surgeon: Lockie Mola, MD;  Location: [facility];  Service:                Ophthalmology;  Laterality: Left;  Total Time:               00:57 Total Equivalent Power: 12.3% Cumulative               Dissipated Energy: 7.05 No date: COLONOSCOPY No date: FOOT SURGERY; Right 01/19/2021: FOREIGN BODY REMOVAL; Left     Comment:  Procedure: REMOVAL FOREIGN BODY EXTREMITY;  Surgeon:               Rosetta Posner, DPM;  Location: [facility];                Service: Podiatry;  Laterality: Left; No date: pelvic sling 09/15/2015: SHOULDER ARTHROSCOPY WITH OPEN ROTATOR CUFF REPAIR; Right     Comment:  Procedure: SHOULDER ARTHROSCOPY WITH OPEN ROTATOR CUFF               REPAIR;  Surgeon: Christena Flake, MD;  Location: [facility];               Service: Orthopedics;  Laterality: Right; 07/03/2017: SUTURE REMOVAL; Left     Comment:  Procedure: REMOVAL OF RETAINED SUTURE FROM LEFT WRIST;  Surgeon: Christena Flake, MD;  Location: [facility];  Service: Orthopedics;  Laterality: Left; No date: TONSILLECTOMY No date: TUBAL LIGATION 03/28/2017: WRIST ARTHROSCOPY WITH FOVEAL TRIANGULAR FIBROCARTILAGE  COMPLEX REPAIR; Left     Comment:  Procedure: WRIST ARTHROSCOPY WITH DEBRIDEMENT AND REPAIR              OF TFCC TEAR;  Surgeon: Christena Flake, MD;  Location:               [facility];  Service: Orthopedics;  Laterality: Left;  BMI    Body Mass Index: 24.07 kg/m      Reproductive/Obstetrics negative OB ROS                             Anesthesia Physical Anesthesia Plan  ASA: 3  Anesthesia Plan: General   Post-op Pain Management:    Induction: Intravenous  PONV Risk Score and Plan: Propofol infusion and TIVA  Airway Management Planned: Natural Airway and Nasal Cannula  Additional Equipment:   Intra-op Plan:   Post-operative Plan:   Informed Consent: I have reviewed the patients History and Physical, chart, labs and discussed the procedure including the risks, benefits and alternatives for the proposed  anesthesia with the patient or authorized representative who has indicated his/her understanding and acceptance.     Dental Advisory Given  Plan Discussed with: Anesthesiologist, CRNA and Surgeon  Anesthesia Plan Comments: (Patient consented for risks of anesthesia including but not limited to:  - adverse reactions to medications - risk of airway placement if required - damage to eyes, teeth, lips or other oral mucosa - nerve damage due to positioning  - sore throat or hoarseness - Damage to heart, brain, nerves, lungs, other parts of body or loss of life  Patient voiced understanding.)       Anesthesia Quick Evaluation

## 2022-11-13 NOTE — Op Note (Signed)
[facility]astroenterology Patient Name: Sabrina Burns Procedure Date: 11/13/2022 10:48 AM MRN: [id number] Account #: [id number] Date of Birth: Aug 14, 1945 Admit Type: Outpatient Age: 77 Room: [facility] ENDO ROOM 2 Gender: Female Note Status: Finalized Instrument Name: Upper Endoscope [id number] Procedure:             Upper GI endoscopy Indications:           Abnormal CT of the GI tract Providers:             [name] MD, MD Medicines:             Monitored Anesthesia Care Complications:         No immediate complications. Procedure:             Pre-Anesthesia Assessment:                        - Prior to the procedure, a History and Physical was                         performed, and patient medications, allergies and                         sensitivities were reviewed. The patient's tolerance                         of previous anesthesia was reviewed.                        - The risks and benefits of the procedure and the                         sedation options and risks were discussed with the                         patient. All questions were answered and informed                         consent was obtained.                        - ASA Grade Assessment: II - A patient with mild                         systemic disease.                        After obtaining informed consent, the endoscope was                         passed under direct vision. Throughout the procedure,                         the patient's blood pressure, pulse, and oxygen                         saturations were monitored continuously. The Endoscope                         was introduced through the mouth, and advanced to the  third part of duodenum. The upper GI endoscopy was                         accomplished with ease. The patient tolerated the                         procedure well. Findings:      The esophagus was normal.      The stomach was normal.       The examined duodenum was normal. Impression:            - Normal esophagus.                        - Normal stomach.                        - Normal examined duodenum.                        - No specimens collected. Recommendation:        - Return patient to hospital ward for ongoing care.                        - Advance diet as tolerated.                        - Continue present medications. Procedure Code(s):     --- Professional ---                        [id number], Esophagogastroduodenoscopy, flexible,                         transoral; diagnostic, including collection of                         specimen(s) by brushing or washing, when performed                         (separate procedure) Diagnosis Code(s):     --- Professional ---                        R93.3, Abnormal findings on diagnostic imaging of                         other parts of digestive tract CPT copyright 2022 [employer]. All rights reserved. The codes documented in this report are preliminary and upon coder review may  be revised to meet current compliance requirements. [name], MD [name] MD, MD 11/13/2022 11:06:25 AM This report has been signed electronically. Number of Addenda: 0 Note Initiated On: 11/13/2022 10:48 AM Estimated Blood Loss:  Estimated blood loss: none.      [facility]

## 2022-11-13 NOTE — Anesthesia Postprocedure Evaluation (Signed)
Anesthesia Post Note  Patient: Sabrina Burns  Procedure(s) Performed: ESOPHAGOGASTRODUODENOSCOPY (EGD) WITH PROPOFOL  Patient location during evaluation: Endoscopy Anesthesia Type: General Level of consciousness: awake and alert Pain management: pain level controlled Vital Signs Assessment: post-procedure vital signs reviewed and stable Respiratory status: spontaneous breathing, nonlabored ventilation, respiratory function stable and patient connected to nasal cannula oxygen Cardiovascular status: blood pressure returned to baseline and stable Postop Assessment: no apparent nausea or vomiting Anesthetic complications: no   No notable events documented.   Last Vitals:  Vitals:   11/13/22 1129 11/13/22 1139  BP:    Pulse:  82  Resp: 18 16  Temp:    SpO2:  96%    Last Pain:  Vitals:   11/13/22 1139  TempSrc:   PainSc: 0-No pain                 Cleda Mccreedy Kainen Struckman

## 2022-11-13 NOTE — Discharge Summary (Signed)
Physician Discharge Summary   Patient: Sabrina Burns MRN: [id number] DOB: 1945/10/30  Admit date:     11/11/2022  Discharge date: 11/13/22  Discharge Physician: Marrion Coy   PCP: Mick Sell, MD   Recommendations at discharge:   Follow-up with PCP in 1 week. Follow-up with general surgery in 2 weeks.  Discharge Diagnoses: Principal Problem:   Gallstone pancreatitis Active Problems:   Abdominal pain   Gastric lesion   Cholelithiasis   HLD (hyperlipidemia)   Depression   Multiple thyroid nodules   Abnormal CT scan, gastrointestinal tract   Acute gallstone pancreatitis   Metabolic acidosis  Resolved Problems:   * No resolved hospital problems. *  Hospital Course: Sabrina Burns is a 77 y.o. female with medical history significant of hyperlipidemia, depression, osteoporosis, mitral valve prolapse presenting with abdominal pain, gastric lesion, cholelithiasis.  Patient reports sudden onset of worsening generalized abdominal pain over the past 12 to 24 hours.  Right upper quadrant ultrasound showed cholelithiasis without cholecystitis, lipase elevated at 466. Patient is seen by GI, condition consistent with gallstone pancreatitis.  EGD did not show significant abnormality.  Patient is tolerating diet, condition had improved medically stable for discharge.  Assessment and Plan: Acute gallstone pancreatitis. Positive progressive onset of generalized abdominal pain over the past 24 hours Lipase 466 Seen by GI, consider possibility of gallstone pancreatitis.  Patient received IV fluids, lipase has normalized. Patient so far has been tolerating diet, without nausea vomiting.  Medically stable for discharge, will follow-up with general surgery for gallstone.   Gastric lesion CT imaging w/ soft tissue thickening posterior gastric fundus with an exophytic 9 mm soft tissue component, mural nodule with exophytic component in the posterior stomach not excluded Patient  had EGD performed today, did not show any abnormality in the stomach.       Cholelithiasis No evidence of cholecystitis.  Follow-up with general surgery as outpatient.   Multiple thyroid nodules Patient with history of thyroid nodules. Negative FNA 05/20/2020  Previously evaluated in the [facility] system     Depression Cont celexa    HLD (hyperlipidemia) Cont statin        Consultants: GI Procedures performed: EGD  Disposition: Home Diet recommendation:  Discharge Diet Orders (From admission, onward)     Start     Ordered   11/13/22 0000  Diet - low sodium heart healthy        11/13/22 1355           Cardiac diet DISCHARGE MEDICATION: Allergies as of 11/13/2022   No Known Allergies      Medication List     STOP taking these medications    cefdinir 300 MG capsule Commonly known as: OMNICEF       TAKE these medications    citalopram 20 MG tablet Commonly known as: CELEXA Take 20 mg by mouth daily. In am.   gabapentin 300 MG capsule Commonly known as: NEURONTIN Take 300 mg by mouth 3 (three) times daily.   simvastatin 20 MG tablet Commonly known as: ZOCOR Take 20 mg by mouth daily at 6 PM.   sodium bicarbonate 650 MG tablet Take 1 tablet (650 mg total) by mouth 2 (two) times daily for 7 days.        Follow-up Information     Mick Sell, MD Follow up in 1 week(s).   Specialty: Infectious Diseases Contact information: [address] [place] [address] [phone]  Sakai, Isami, DO Follow up in 2 week(s).   Specialties: General Surgery, Surgery Contact information: [address] [place] [address] [phone]                Discharge Exam: Ceasar Mons Weights   11/11/22 1130  Weight: 57.8 kg   General exam: Appears calm and comfortable  Respiratory system: Clear to auscultation. Respiratory effort normal. Cardiovascular system: S1 & S2 heard, RRR. No JVD, murmurs, rubs, gallops or clicks. No  pedal edema. Gastrointestinal system: Abdomen is nondistended, soft and nontender. No organomegaly or masses felt. Normal bowel sounds heard. Central nervous system: Alert and oriented. No focal neurological deficits. Extremities: Symmetric 5 x 5 power. Skin: No rashes, lesions or ulcers Psychiatry: Judgement and insight appear normal. Mood & affect appropriate.    Condition at discharge: good  The results of significant diagnostics from this hospitalization (including imaging, microbiology, ancillary and laboratory) are listed below for reference.   Imaging Studies: US ABDOMEN LIMITED RUQ (LIVER/GB)  Result Date: 11/11/2022 CLINICAL DATA:  Epigastric pain.  Pancreatitis. EXAM: ULTRASOUND ABDOMEN LIMITED RIGHT UPPER QUADRANT COMPARISON:  CT scan earlier same day FINDINGS: Gallbladder: Single 11 mm gallstone is identified. No gallbladder polyp evident. No gallbladder wall thickening. No sonographic Murphy sign. Common bile duct: Diameter: 5-6 mm Liver: No focal lesion identified. Within normal limits in parenchymal echogenicity. Portal vein is patent on color Doppler imaging with normal direction of blood flow towards the liver. Other: None. IMPRESSION: Cholelithiasis without gallbladder wall thickening or sonographic Murphy sign. Electronically Signed   By: Kennith Center M.D.   On: 11/11/2022 09:22   CT ABDOMEN PELVIS W CONTRAST  Result Date: 11/11/2022 CLINICAL DATA:  Pancreatitis. EXAM: CT ABDOMEN AND PELVIS WITH CONTRAST TECHNIQUE: Multidetector CT imaging of the abdomen and pelvis was performed using the standard protocol following bolus administration of intravenous contrast. RADIATION DOSE REDUCTION: This exam was performed according to the departmental dose-optimization program which includes automated exposure control, adjustment of the mA and/or kV according to patient size and/or use of iterative reconstruction technique. CONTRAST:  OMNIPAQUE IOHEXOL 300 MG/ML  SOLN COMPARISON:   None Available. FINDINGS: Lower chest: Dependent atelectasis in the lung bases. Hepatobiliary: No suspicious focal abnormality within the liver parenchyma. 6 mm focal hyperattenuating lesion in the gallbladder is probably a stone although gallbladder polyp not excluded. No intrahepatic or extrahepatic biliary dilation. Pancreas: No focal mass lesion. No dilatation of the main duct. No intraparenchymal cyst. No peripancreatic edema. Spleen: No splenomegaly. No suspicious focal mass lesion. Adrenals/Urinary Tract: No adrenal nodule or mass. Kidneys unremarkable. No evidence for hydroureter. The urinary bladder appears normal for the degree of distention. Stomach/Bowel: Focal soft tissue thickening noted posterior gastric fundus with an exophytic 9 mm soft tissue component (axial 21/2, sagittal 53/6 and coronal 45/5). This may be a tiny fundal diverticulum without fluid or gas. Mural nodule in the posterior stomach not excluded. Duodenum is normally positioned as is the ligament of Treitz. No small bowel wall thickening. No small bowel dilatation. The terminal ileum is normal. The appendix is normal. No gross colonic mass. No colonic wall thickening. Diverticular changes are noted in the left colon without evidence of diverticulitis. Vascular/Lymphatic: There is mild atherosclerotic calcification of the abdominal aorta without aneurysm. There is no gastrohepatic or hepatoduodenal ligament lymphadenopathy. No retroperitoneal or mesenteric lymphadenopathy. No pelvic sidewall lymphadenopathy. Reproductive: Hysterectomy.  There is no adnexal mass. Other: No intraperitoneal free fluid. Musculoskeletal: Tiny umbilical hernia contains only fat. No worrisome lytic or sclerotic  osseous abnormality. 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 IMPRESSION: 1. No acute findings in the abdomen or pelvis. Specifically, no CT findings to suggest acute pancreatitis. 2. Focal soft tissue thickening posterior gastric fundus with  an exophytic 9 mm soft tissue component. This may be a tiny fundal diverticulum without fluid or gas. Mural nodule with exophytic component in the posterior stomach not excluded. GI consultation recommended. 3. 6 mm focal hyperattenuating lesion in the gallbladder is probably a stone although gallbladder polyp not excluded. Right upper quadrant ultrasound recommended to further evaluate. 4. Left colonic diverticulosis without diverticulitis. 5.  Aortic Atherosclerosis (ICD10-I70.0). Electronically Signed   By: Kennith Center M.D.   On: 11/11/2022 06:58   DG Chest Port 1 View  Result Date: 11/11/2022 CLINICAL DATA:  Chest pain under the left breast radiating to the left shoulder EXAM: PORTABLE CHEST 1 VIEW COMPARISON:  Radiographs 07/11/2021 FINDINGS: Stable cardiomediastinal silhouette. Aortic atherosclerotic calcification. Similar left basilar atelectasis or scarring. Remote left rib fractures. No pleural effusion or pneumothorax. IMPRESSION: No active disease. Electronically Signed   By: Minerva Fester M.D.   On: 11/11/2022 03:28    Microbiology: Results for orders placed or performed during the hospital encounter of 11/28/18  SARS CORONAVIRUS 2 (TAT 6-24 HRS) Nasopharyngeal Nasopharyngeal Swab     Status: None   Collection Time: 11/28/18 11:56 AM   Specimen: Nasopharyngeal Swab  Result Value Ref Range Status   SARS Coronavirus 2 NEGATIVE NEGATIVE Final    Comment: (NOTE) SARS-CoV-2 target nucleic acids are NOT DETECTED. The SARS-CoV-2 RNA is generally detectable in upper and lower respiratory specimens during the acute phase of infection. Negative results do not preclude SARS-CoV-2 infection, do not rule out co-infections with other pathogens, and should not be used as the sole basis for treatment or other patient management decisions. Negative results must be combined with clinical observations, patient history, and epidemiological information. The expected result is Negative. Fact Sheet  for Patients: HairSlick.no Fact Sheet for Healthcare Providers: quierodirigir.com This test is not yet approved or cleared by the [place] FDA and  has been authorized for detection and/or diagnosis of SARS-CoV-2 by FDA under an Emergency Use Authorization (EUA). This EUA will remain  in effect (meaning this test can be used) for the duration of the COVID-19 declaration under Section 56 4(b)(1) of the Act, 21 U.S.C. section 360bbb-3(b)(1), unless the authorization is terminated or revoked sooner. Performed at [facility], [address], [place], [place] [address]     Labs: CBC: Recent Labs  Lab 11/11/22 0143 11/12/22 0425  WBC 7.3 6.3  HGB 14.4 12.2  HCT 42.9 36.0  MCV 93.3 92.8  PLT 208 166   Basic Metabolic Panel: Recent Labs  Lab 11/11/22 0143 11/12/22 0425 11/12/22 1006 11/13/22 0441  NA 138 137  --  140  K 4.0 3.9  --  4.0  CL 105 110  --  114*  CO2 23 21*  --  21*  GLUCOSE 118* 106*  --  93  BUN 19 9  --  6*  CREATININE 0.99 0.73  --  0.65  CALCIUM 9.0 7.5*  --  8.1*  MG  --   --  2.2 2.2   Liver Function Tests: Recent Labs  Lab 11/11/22 0143 11/12/22 0425  AST 18 14*  ALT 17 13  ALKPHOS 49 45  BILITOT 1.5* 2.5*  PROT 6.7 5.5*  ALBUMIN 4.2 3.5   CBG: No results for input(s): "GLUCAP" in the last  168 hours.  Discharge time spent: greater than 30 minutes.  Signed: Marrion Coy, MD [facility] 11/13/2022

## 2022-11-13 NOTE — Transfer of Care (Signed)
Immediate Anesthesia Transfer of Care Note  Patient: Sabrina Burns  Procedure(s) Performed: ESOPHAGOGASTRODUODENOSCOPY (EGD) WITH PROPOFOL  Patient Location: PACU  Anesthesia Type:General  Level of Consciousness: drowsy  Airway & Oxygen Therapy: Patient Spontanous Breathing  Post-op Assessment: Report given to RN and Post -op Vital signs reviewed and stable  Post vital signs: Reviewed and stable  Last Vitals:  Vitals Value Taken Time  BP 145/80 11/13/22 1119  Temp 36.7 C 11/13/22 1109  Pulse 84 11/13/22 1125  Resp 16 11/13/22 1109  SpO2 95 % 11/13/22 1125  Vitals shown include unfiled device data.  Last Pain:  Vitals:   11/13/22 1109  TempSrc: Temporal  PainSc: Asleep      Patients Stated Pain Goal: 0 (11/12/22 2145)  Complications: No notable events documented.

## 2022-11-13 NOTE — Plan of Care (Signed)

## 2022-11-14 ENCOUNTER — Encounter: Payer: Self-pay | Admitting: Gastroenterology

## 2022-11-20 ENCOUNTER — Ambulatory Visit: Payer: Self-pay | Admitting: Surgery

## 2022-11-20 NOTE — H&P (View-Only) (Signed)
 Subjective:   CC: Gallstone pancreatitis (HHS-HCC) [K85.10]  HPI:  Sabrina Burns is a 77 y.o. female who was referred by Chipper Herb for evaluation of above CC. Symptoms were first noted several weeks ago. Pain is achy and intermittent, confined to the epigastric area, without radiation.  Associated with nausea, exacerbated by nothing specific. Recent acute exacerbation and workup noted gallstone pancreatitis.  EGD performed for possible gastric lesion which was negative.  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  Past Surgical History:  has a past surgical history that includes Hysterectomy; Tonsillectomy; Tubal ligation; Colonoscopy (03/2007); Pelvic sling; Limited arthroscopic debridement, arthroscopic subacromial decompression, mini-open rotator cuff repair, and mini-open biceps tendesis, right shoulder (Right, 09/15/2015); Arthroscopic debridement with arthroscopically assisted TFCC repair,left wrist  (Left, 03/28/2017); and removal of painful retained sutures x 2 left wrist  (Left, 07/03/2017).  Family History: family history includes Brain cancer in her brother; Lung cancer in her brother; Myocardial Infarction (Heart attack) in her father; Osteoporosis (Thinning of bones) in her mother.  Social History:  reports that she has never smoked. She has never used smokeless tobacco. She reports that she does not drink alcohol and does not use drugs.  Current Medications: has a current medication list which includes the following prescription(s): citalopram, magnesium oxide, and simvastatin, and the following Facility-Administered Medications:  cyanocobalamin.  Allergies:  Allergies as of 11/20/2022   (No Known Allergies)    ROS:  A 15 point review of systems was performed and pertinent positives and negatives noted in HPI    Objective:     BP 113/67   Pulse 87   Ht 154.9 cm (5' 0.98")   Wt 58.5 kg (128 lb 15.5 oz)   BMI 24.38 kg/m    Constitutional :  No distress, cooperative, alert  Lymphatics/Throat:  Supple with no lymphadenopathy  Respiratory:  Clear to auscultation bilaterally  Cardiovascular:  Regular rate and rhythm  Gastrointestinal: Soft, non-tender, non-distended, no organomegaly.  Musculoskeletal: Steady gait and movement  Skin: Cool and moist  Psychiatric: Normal affect, non-agitated, not confused       LABS:  N/a   RADS: CLINICAL DATA:  Epigastric pain.  Pancreatitis.   EXAM:  ULTRASOUND ABDOMEN LIMITED RIGHT UPPER QUADRANT   COMPARISON:  CT scan earlier same day   FINDINGS:  Gallbladder:   Single 11 mm gallstone is identified. No gallbladder polyp evident.  No gallbladder wall thickening. No sonographic Murphy sign.   Common bile duct:   Diameter: 5-6 mm   Liver:   No focal lesion identified. Within normal limits in parenchymal  echogenicity. Portal vein is patent on color Doppler imaging with  normal direction of blood flow towards the liver.   Other: None.   IMPRESSION:  Cholelithiasis without gallbladder wall thickening or sonographic  Murphy sign.    Electronically Signed    By: Kennith Center M.D.    On: 11/11/2022 09:22   CLINICAL DATA:  Pancreatitis.   EXAM:  CT ABDOMEN AND PELVIS WITH CONTRAST   TECHNIQUE:  Multidetector CT imaging of the abdomen and pelvis was performed  using the standard protocol following bolus administration of  intravenous contrast.   RADIATION DOSE REDUCTION: This exam was performed according to the  departmental dose-optimization  program which includes automated  exposure control, adjustment of the mA and/or kV according to   patient size and/or use of iterative reconstruction technique.   CONTRAST:  OMNIPAQUE IOHEXOL 300 MG/ML  SOLN   COMPARISON:  None Available.   FINDINGS:  Lower chest: Dependent atelectasis in the lung bases.   Hepatobiliary: No suspicious focal abnormality within the liver  parenchyma. 6 mm focal hyperattenuating lesion in the gallbladder is  probably a stone although gallbladder polyp not excluded. No  intrahepatic or extrahepatic biliary dilation.   Pancreas: No focal mass lesion. No dilatation of the main duct. No  intraparenchymal cyst. No peripancreatic edema.   Spleen: No splenomegaly. No suspicious focal mass lesion.   Adrenals/Urinary Tract: No adrenal nodule or mass. Kidneys  unremarkable. No evidence for hydroureter. The urinary bladder  appears normal for the degree of distention.   Stomach/Bowel: Focal soft tissue thickening noted posterior gastric  fundus with an exophytic 9 mm soft tissue component (axial 21/2,  sagittal 53/6 and coronal 45/5). This may be a tiny fundal  diverticulum without fluid or gas. Mural nodule in the posterior  stomach not excluded. Duodenum is normally positioned as is the  ligament of Treitz. No small bowel wall thickening. No small bowel  dilatation. The terminal ileum is normal. The appendix is normal. No  gross colonic mass. No colonic wall thickening. Diverticular changes  are noted in the left colon without evidence of diverticulitis.   Vascular/Lymphatic: There is mild atherosclerotic calcification of  the abdominal aorta without aneurysm. There is no gastrohepatic or  hepatoduodenal ligament lymphadenopathy. No retroperitoneal or  mesenteric lymphadenopathy. No pelvic sidewall lymphadenopathy.   Reproductive: Hysterectomy.  There is no adnexal mass.   Other: No intraperitoneal free fluid.   Musculoskeletal: Tiny umbilical hernia contains only fat. No  worrisome lytic or sclerotic osseous abnormality. 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   IMPRESSION:  1. No acute findings in the abdomen or pelvis. Specifically, no CT  findings to suggest acute pancreatitis.  2. Focal soft tissue thickening posterior gastric fundus with an  exophytic 9 mm soft tissue component. This may be a tiny fundal  diverticulum without fluid or gas. Mural nodule with exophytic  component in the posterior stomach not excluded. GI consultation  recommended.  3. 6 mm focal hyperattenuating lesion in the gallbladder is probably  a stone although gallbladder polyp not excluded. Right upper  quadrant ultrasound recommended to further evaluate.  4. Left colonic diverticulosis without diverticulitis.  5.  Aortic Atherosclerosis (ICD10-I70.0).    Electronically Signed    By: Kennith Center M.D.    On: 11/11/2022 06:58   Assessment:      Gallstone pancreatitis (HHS-HCC) [K85.10]  Plan:     1. Gallstone pancreatitis (HHS-HCC) [K85.10] Discussed the risk of surgery including post-op infxn, seroma, biloma, chronic pain, poor-delayed wound healing, retained gallstone, conversion to open procedure, post-op SBO or ileus, and need for additional procedures to address said risks.  The risks of general anesthetic including MI, CVA, sudden death or even reaction to anesthetic medications also discussed. Alternatives include continued observation.  Benefits include possible symptom relief, prevention of complications including acute cholecystitis, pancreatitis.  Typical post operative recovery of 3-5 days rest, continued pain in area and incision sites, possible loose stools up to 4-6 weeks, also discussed.  ED return precautions given for sudden increase in RUQ pain, with possible accompanying fever, nausea, and/or vomiting.  The patient understands the risks, any and all  questions were answered to the patient's satisfaction.  2. Patient has elected to proceed with surgical treatment. Procedure will be scheduled.  Written  consent was obtained. robotic assisted laparoscopic  labs/images/medications/previous chart entries reviewed personally and relevant changes/updates noted above.

## 2022-11-20 NOTE — H&P (Addendum)
Subjective:   CC: Gallstone pancreatitis (HHS-HCC) [K85.10]  HPI:  Sabrina Burns is a 77 y.o. female who was referred by Chipper Herb for evaluation of above CC. Symptoms were first noted several weeks ago. Pain is achy and intermittent, confined to the epigastric area, without radiation.  Associated with nausea, exacerbated by nothing specific. Recent acute exacerbation and workup noted gallstone pancreatitis.  EGD performed for possible gastric lesion which was negative.  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  Past Surgical History:  has a past surgical history that includes Hysterectomy; Tonsillectomy; Tubal ligation; Colonoscopy (03/2007); Pelvic sling; Limited arthroscopic debridement, arthroscopic subacromial decompression, mini-open rotator cuff repair, and mini-open biceps tendesis, right shoulder (Right, 09/15/2015); Arthroscopic debridement with arthroscopically assisted TFCC repair,left wrist  (Left, 03/28/2017); and removal of painful retained sutures x 2 left wrist  (Left, 07/03/2017).  Family History: family history includes Brain cancer in her brother; Lung cancer in her brother; Myocardial Infarction (Heart attack) in her father; Osteoporosis (Thinning of bones) in her mother.  Social History:  reports that she has never smoked. She has never used smokeless tobacco. She reports that she does not drink alcohol and does not use drugs.  Current Medications: has a current medication list which includes the following prescription(s): citalopram, magnesium oxide, and simvastatin, and the following Facility-Administered Medications:  cyanocobalamin.  Allergies:  Allergies as of 11/20/2022   (No Known Allergies)    ROS:  A 15 point review of systems was performed and pertinent positives and negatives noted in HPI    Objective:     BP 113/67   Pulse 87   Ht 154.9 cm (5' 0.98")   Wt 58.5 kg (128 lb 15.5 oz)   BMI 24.38 kg/m    Constitutional :  No distress, cooperative, alert  Lymphatics/Throat:  Supple with no lymphadenopathy  Respiratory:  Clear to auscultation bilaterally  Cardiovascular:  Regular rate and rhythm  Gastrointestinal: Soft, non-tender, non-distended, no organomegaly.  Musculoskeletal: Steady gait and movement  Skin: Cool and moist  Psychiatric: Normal affect, non-agitated, not confused       LABS:  N/a   RADS: CLINICAL DATA:  Epigastric pain.  Pancreatitis.   EXAM:  ULTRASOUND ABDOMEN LIMITED RIGHT UPPER QUADRANT   COMPARISON:  CT scan earlier same day   FINDINGS:  Gallbladder:   Single 11 mm gallstone is identified. No gallbladder polyp evident.  No gallbladder wall thickening. No sonographic Murphy sign.   Common bile duct:   Diameter: 5-6 mm   Liver:   No focal lesion identified. Within normal limits in parenchymal  echogenicity. Portal vein is patent on color Doppler imaging with  normal direction of blood flow towards the liver.   Other: None.   IMPRESSION:  Cholelithiasis without gallbladder wall thickening or sonographic  Murphy sign.    Electronically Signed    By: Kennith Center M.D.    On: 11/11/2022 09:22   CLINICAL DATA:  Pancreatitis.   EXAM:  CT ABDOMEN AND PELVIS WITH CONTRAST   TECHNIQUE:  Multidetector CT imaging of the abdomen and pelvis was performed  using the standard protocol following bolus administration of  intravenous contrast.   RADIATION DOSE REDUCTION: This exam was performed according to the  departmental dose-optimization  program which includes automated  exposure control, adjustment of the mA and/or kV according to   patient size and/or use of iterative reconstruction technique.   CONTRAST:  OMNIPAQUE IOHEXOL 300 MG/ML  SOLN   COMPARISON:  None Available.   FINDINGS:  Lower chest: Dependent atelectasis in the lung bases.   Hepatobiliary: No suspicious focal abnormality within the liver  parenchyma. 6 mm focal hyperattenuating lesion in the gallbladder is  probably a stone although gallbladder polyp not excluded. No  intrahepatic or extrahepatic biliary dilation.   Pancreas: No focal mass lesion. No dilatation of the main duct. No  intraparenchymal cyst. No peripancreatic edema.   Spleen: No splenomegaly. No suspicious focal mass lesion.   Adrenals/Urinary Tract: No adrenal nodule or mass. Kidneys  unremarkable. No evidence for hydroureter. The urinary bladder  appears normal for the degree of distention.   Stomach/Bowel: Focal soft tissue thickening noted posterior gastric  fundus with an exophytic 9 mm soft tissue component (axial 21/2,  sagittal 53/6 and coronal 45/5). This may be a tiny fundal  diverticulum without fluid or gas. Mural nodule in the posterior  stomach not excluded. Duodenum is normally positioned as is the  ligament of Treitz. No small bowel wall thickening. No small bowel  dilatation. The terminal ileum is normal. The appendix is normal. No  gross colonic mass. No colonic wall thickening. Diverticular changes  are noted in the left colon without evidence of diverticulitis.   Vascular/Lymphatic: There is mild atherosclerotic calcification of  the abdominal aorta without aneurysm. There is no gastrohepatic or  hepatoduodenal ligament lymphadenopathy. No retroperitoneal or  mesenteric lymphadenopathy. No pelvic sidewall lymphadenopathy.   Reproductive: Hysterectomy.  There is no adnexal mass.   Other: No intraperitoneal free fluid.   Musculoskeletal: Tiny umbilical hernia contains only fat. No  worrisome lytic or sclerotic osseous abnormality. 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   IMPRESSION:  1. No acute findings in the abdomen or pelvis. Specifically, no CT  findings to suggest acute pancreatitis.  2. Focal soft tissue thickening posterior gastric fundus with an  exophytic 9 mm soft tissue component. This may be a tiny fundal  diverticulum without fluid or gas. Mural nodule with exophytic  component in the posterior stomach not excluded. GI consultation  recommended.  3. 6 mm focal hyperattenuating lesion in the gallbladder is probably  a stone although gallbladder polyp not excluded. Right upper  quadrant ultrasound recommended to further evaluate.  4. Left colonic diverticulosis without diverticulitis.  5.  Aortic Atherosclerosis (ICD10-I70.0).    Electronically Signed    By: Kennith Center M.D.    On: 11/11/2022 06:58   Assessment:      Gallstone pancreatitis (HHS-HCC) [K85.10]  Plan:     1. Gallstone pancreatitis (HHS-HCC) [K85.10] Discussed the risk of surgery including post-op infxn, seroma, biloma, chronic pain, poor-delayed wound healing, retained gallstone, conversion to open procedure, post-op SBO or ileus, and need for additional procedures to address said risks.  The risks of general anesthetic including MI, CVA, sudden death or even reaction to anesthetic medications also discussed. Alternatives include continued observation.  Benefits include possible symptom relief, prevention of complications including acute cholecystitis, pancreatitis.  Typical post operative recovery of 3-5 days rest, continued pain in area and incision sites, possible loose stools up to 4-6 weeks, also discussed.  ED return precautions given for sudden increase in RUQ pain, with possible accompanying fever, nausea, and/or vomiting.  The patient understands the risks, any and all  questions were answered to the patient's satisfaction.  2. Patient has elected to proceed with surgical treatment. Procedure will be scheduled.  Written  consent was obtained. robotic assisted laparoscopic  labs/images/medications/previous chart entries reviewed personally and relevant changes/updates noted above.

## 2022-11-22 ENCOUNTER — Encounter
Admission: RE | Admit: 2022-11-22 | Discharge: 2022-11-22 | Disposition: A | Discharge status: Home / Self Care | Payer: Medicare HMO | Source: Ambulatory Visit | Attending: Surgery | Admitting: Surgery

## 2022-11-22 HISTORY — DX: Nontoxic multinodular goiter: E04.2

## 2022-11-22 HISTORY — DX: COVID-19: U07.1

## 2022-11-22 HISTORY — DX: Gastro-esophageal reflux disease without esophagitis: K21.9

## 2022-11-22 HISTORY — DX: Disease of stomach and duodenum, unspecified: K31.9

## 2022-11-22 HISTORY — DX: Atherosclerosis of aorta: I70.0

## 2022-11-22 HISTORY — DX: Biliary acute pancreatitis without necrosis or infection: K85.10

## 2022-11-22 HISTORY — DX: Acidosis, unspecified: E87.20

## 2022-11-22 HISTORY — DX: Diverticulosis of intestine, part unspecified, without perforation or abscess without bleeding: K57.90

## 2022-11-22 HISTORY — DX: Calculus of gallbladder without cholecystitis without obstruction: K80.20

## 2022-11-22 NOTE — Patient Instructions (Signed)
Your procedure is scheduled on:11-29-22 Thursday Report to the Registration Desk on the 1st floor of the [facility].Then proceed to the 2nd floor Surgery Desk To find out your arrival time, please call [phone] between 1PM - 3PM on:11-28-22 Wednesday If your arrival time is 6:00 am, do not arrive before that time as the [facility] entrance doors do not open until 6:00 am.  REMEMBER: Instructions that are not followed completely may result in serious medical risk, up to and including death; or upon the discretion of your surgeon and anesthesiologist your surgery may need to be rescheduled.  Do not eat food after midnight the night before surgery.  No gum chewing or hard candies.  You may however, drink CLEAR liquids up to 2 hours before you are scheduled to arrive for your surgery. Do not drink anything within 2 hours of your scheduled arrival time.  Clear liquids include: - water  - apple juice without pulp - gatorade (not RED colors) - black coffee or tea (Do NOT add milk or creamers to the coffee or tea) Do NOT drink anything that is not on this list.  One week prior to surgery: Stop Anti-inflammatories (NSAIDS) such as Advil, Aleve, Ibuprofen, Motrin, Naproxen, Naprosyn and Aspirin based products such as Excedrin, Goody's Powder, BC Powder.You may however, take Tylenol if needed for pain up until the day of surgery. Stop ANY OVER THE COUNTER supplements/vitamins NOW (11-22-22) until after surgery.You may continue your Triple Magnesium Complex up until the night prior to surgery   Continue taking all prescribed medications   TAKE ONLY THESE MEDICATIONS THE MORNING OF SURGERY WITH A SIP OF WATER: -citalopram (CELEXA)   No Alcohol for 24 hours before or after surgery.  No Smoking including e-cigarettes for 24 hours before surgery.  No chewable tobacco products for at least 6 hours before surgery.  No nicotine patches on the day of surgery.  Do not use any "recreational" drugs  for at least a week (preferably 2 weeks) before your surgery.  Please be advised that the combination of cocaine and anesthesia may have negative outcomes, up to and including death. If you test positive for cocaine, your surgery will be cancelled.  On the morning of surgery brush your teeth with toothpaste and water, you may rinse your mouth with mouthwash if you wish. Do not swallow any toothpaste or mouthwash.  Use CHG Soap as directed on instruction sheet.  Do not wear jewelry, make-up, hairpins, clips or nail polish.  Do not wear lotions, powders, or perfumes.   Do not shave body hair from the neck down 48 hours before surgery.  Contact lenses, hearing aids and dentures may not be worn into surgery.  Do not bring valuables to the hospital. [facility] is not responsible for any missing/lost belongings or valuables.   Notify your doctor if there is any change in your medical condition (cold, fever, infection).  Wear comfortable clothing (specific to your surgery type) to the hospital.  After surgery, you can help prevent lung complications by doing breathing exercises.  Take deep breaths and cough every 1-2 hours. Your doctor may order a device called an Incentive Spirometer to help you take deep breaths. When coughing or sneezing, hold a pillow firmly against your incision with both hands. This is called "splinting." Doing this helps protect your incision. It also decreases belly discomfort.  If you are being admitted to the hospital overnight, leave your suitcase in the car. After surgery it may be brought  to your room.  In case of increased patient census, it may be necessary for you, the patient, to continue your postoperative care in the Same Day Surgery department.  If you are being discharged the day of surgery, you will not be allowed to drive home. You will need a responsible individual to drive you home and stay with you for 24 hours after surgery.   If you are taking  public transportation, you will need to have a responsible individual with you.  Please call the Pre-admissions Testing Dept. at [phone] if you have any questions about these instructions.  Surgery Visitation Policy:  Patients having surgery or a procedure may have two visitors.  Children under the age of 73 must have an adult with them who is not the patient.     Preparing for Surgery with CHLORHEXIDINE GLUCONATE (CHG) Soap  Chlorhexidine Gluconate (CHG) Soap  o An antiseptic cleaner that kills germs and bonds with the skin to continue killing germs even after washing  o Used for showering the night before surgery and morning of surgery  Before surgery, you can play an important role by reducing the number of germs on your skin.  CHG (Chlorhexidine gluconate) soap is an antiseptic cleanser which kills germs and bonds with the skin to continue killing germs even after washing.  Please do not use if you have an allergy to CHG or antibacterial soaps. If your skin becomes reddened/irritated stop using the CHG.  1. Shower the NIGHT BEFORE SURGERY and the MORNING OF SURGERY with CHG soap.  2. If you choose to wash your hair, wash your hair first as usual with your normal shampoo.  3. After shampooing, rinse your hair and body thoroughly to remove the shampoo.  4. Use CHG as you would any other liquid soap. You can apply CHG directly to the skin and wash gently with a scrungie or a clean washcloth.  5. Apply the CHG soap to your body only from the neck down. Do not use on open wounds or open sores. Avoid contact with your eyes, ears, mouth, and genitals (private parts). Wash face and genitals (private parts) with your normal soap.  6. Wash thoroughly, paying special attention to the area where your surgery will be performed.  7. Thoroughly rinse your body with warm water.  8. Do not shower/wash with your normal soap after using and rinsing off the CHG soap.  9. Pat yourself  dry with a clean towel.  10. Wear clean pajamas to bed the night before surgery.  12. Place clean sheets on your bed the night of your first shower and do not sleep with pets.  13. Shower again with the CHG soap on the day of surgery prior to arriving at the hospital.  14. Do not apply any deodorants/lotions/powders.  15. Please wear clean clothes to the hospital.

## 2022-11-29 ENCOUNTER — Encounter: Payer: Self-pay | Admitting: Surgery

## 2022-11-29 ENCOUNTER — Ambulatory Visit
Admission: RE | Admit: 2022-11-29 | Discharge: 2022-11-29 | Disposition: A | Discharge status: Home / Self Care | Payer: Medicare HMO | Attending: Surgery | Admitting: Surgery

## 2022-11-29 ENCOUNTER — Ambulatory Visit: Payer: Medicare HMO | Admitting: Certified Registered Nurse Anesthetist

## 2022-11-29 ENCOUNTER — Encounter
Admission: RE | Disposition: A | Discharge status: Home / Self Care | Payer: Self-pay | Source: Home / Self Care | Attending: Surgery

## 2022-11-29 ENCOUNTER — Other Ambulatory Visit: Payer: Self-pay

## 2022-11-29 DIAGNOSIS — F32A Depression, unspecified: Secondary | ICD-10-CM | POA: Diagnosis not present

## 2022-11-29 DIAGNOSIS — K8020 Calculus of gallbladder without cholecystitis without obstruction: Secondary | ICD-10-CM | POA: Insufficient documentation

## 2022-11-29 DIAGNOSIS — K5730 Diverticulosis of large intestine without perforation or abscess without bleeding: Secondary | ICD-10-CM | POA: Diagnosis not present

## 2022-11-29 DIAGNOSIS — K851 Biliary acute pancreatitis without necrosis or infection: Secondary | ICD-10-CM

## 2022-11-29 DIAGNOSIS — E785 Hyperlipidemia, unspecified: Secondary | ICD-10-CM | POA: Diagnosis not present

## 2022-11-29 SURGERY — CHOLECYSTECTOMY, ROBOT-ASSISTED, LAPAROSCOPIC
Anesthesia: General | Site: Abdomen

## 2022-11-29 MED ORDER — DEXAMETHASONE SODIUM PHOSPHATE 10 MG/ML IJ SOLN
INTRAMUSCULAR | Status: DC | PRN
  Administered 2022-11-29: 5 mg via INTRAVENOUS

## 2022-11-29 MED ORDER — DEXAMETHASONE SODIUM PHOSPHATE 10 MG/ML IJ SOLN
INTRAMUSCULAR | Status: AC
  Filled 2022-11-29: qty 1

## 2022-11-29 MED ORDER — LACTATED RINGERS IV SOLN: INTRAVENOUS | Status: DC

## 2022-11-29 MED ORDER — LIDOCAINE-EPINEPHRINE 1 %-1:100000 IJ SOLN
INTRAMUSCULAR | Status: AC
  Filled 2022-11-29: qty 1

## 2022-11-29 MED ORDER — FENTANYL CITRATE (PF) 100 MCG/2ML IJ SOLN
25.0000 ug | INTRAMUSCULAR | Status: DC | PRN
  Administered 2022-11-29 (×4): 25 ug via INTRAVENOUS

## 2022-11-29 MED ORDER — ROCURONIUM BROMIDE 10 MG/ML (PF) SYRINGE
PREFILLED_SYRINGE | INTRAVENOUS | Status: AC
  Filled 2022-11-29: qty 10

## 2022-11-29 MED ORDER — ONDANSETRON HCL 4 MG/2ML IJ SOLN
INTRAMUSCULAR | Status: DC | PRN
  Administered 2022-11-29: 4 mg via INTRAVENOUS

## 2022-11-29 MED ORDER — INDOCYANINE GREEN 25 MG IV SOLR
INTRAVENOUS | Status: AC
  Filled 2022-11-29: qty 10

## 2022-11-29 MED ORDER — DOCUSATE SODIUM 100 MG PO CAPS: 100.0000 mg | ORAL_CAPSULE | Freq: Two times a day (BID) | ORAL | 0 refills | Status: AC | PRN

## 2022-11-29 MED ORDER — CHLORHEXIDINE GLUCONATE 0.12 % MT SOLN
15.0000 mL | Freq: Once | OROMUCOSAL | Status: AC
  Administered 2022-11-29: 15 mL via OROMUCOSAL

## 2022-11-29 MED ORDER — FAMOTIDINE 20 MG PO TABS
ORAL_TABLET | ORAL | Status: AC
  Filled 2022-11-29: qty 1

## 2022-11-29 MED ORDER — MIDAZOLAM HCL 2 MG/2ML IJ SOLN
INTRAMUSCULAR | Status: AC
  Filled 2022-11-29: qty 2

## 2022-11-29 MED ORDER — INDOCYANINE GREEN 25 MG IV SOLR
1.2500 mg | Freq: Once | INTRAVENOUS | Status: AC
  Administered 2022-11-29: 1.25 mg via INTRAVENOUS

## 2022-11-29 MED ORDER — ONDANSETRON HCL 4 MG/2ML IJ SOLN
INTRAMUSCULAR | Status: AC
  Filled 2022-11-29: qty 2

## 2022-11-29 MED ORDER — PROPOFOL 10 MG/ML IV BOLUS
INTRAVENOUS | Status: DC | PRN
  Administered 2022-11-29: 110 mg via INTRAVENOUS

## 2022-11-29 MED ORDER — ORAL CARE MOUTH RINSE: 15.0000 mL | Freq: Once | OROMUCOSAL | Status: AC

## 2022-11-29 MED ORDER — 0.9 % SODIUM CHLORIDE (POUR BTL) OPTIME
TOPICAL | Status: DC | PRN
  Administered 2022-11-29: 500 mL

## 2022-11-29 MED ORDER — BUPIVACAINE HCL (PF) 0.5 % IJ SOLN
INTRAMUSCULAR | Status: AC
  Filled 2022-11-29: qty 30

## 2022-11-29 MED ORDER — ACETAMINOPHEN 10 MG/ML IV SOLN
INTRAVENOUS | Status: DC | PRN
  Administered 2022-11-29: 1000 mg via INTRAVENOUS

## 2022-11-29 MED ORDER — FENTANYL CITRATE (PF) 100 MCG/2ML IJ SOLN
INTRAMUSCULAR | Status: AC
  Filled 2022-11-29: qty 2

## 2022-11-29 MED ORDER — LIDOCAINE-EPINEPHRINE (PF) 1 %-1:200000 IJ SOLN
INTRAMUSCULAR | Status: DC | PRN
  Administered 2022-11-29: 23 mL via INTRAMUSCULAR

## 2022-11-29 MED ORDER — OXYCODONE HCL 5 MG PO TABS
5.0000 mg | ORAL_TABLET | Freq: Once | ORAL | Status: AC | PRN
  Administered 2022-11-29: 5 mg via ORAL

## 2022-11-29 MED ORDER — FENTANYL CITRATE (PF) 100 MCG/2ML IJ SOLN
INTRAMUSCULAR | Status: DC | PRN
  Administered 2022-11-29 (×2): 50 ug via INTRAVENOUS

## 2022-11-29 MED ORDER — LIDOCAINE HCL (PF) 2 % IJ SOLN
INTRAMUSCULAR | Status: AC
  Filled 2022-11-29: qty 5

## 2022-11-29 MED ORDER — ACETAMINOPHEN 325 MG PO TABS: 650.0000 mg | ORAL_TABLET | Freq: Three times a day (TID) | ORAL | 0 refills | Status: AC | PRN

## 2022-11-29 MED ORDER — OXYCODONE HCL 5 MG/5ML PO SOLN: 5.0000 mg | Freq: Once | ORAL | Status: AC | PRN

## 2022-11-29 MED ORDER — SUGAMMADEX SODIUM 200 MG/2ML IV SOLN
INTRAVENOUS | Status: DC | PRN
  Administered 2022-11-29: 150 mg via INTRAVENOUS

## 2022-11-29 MED ORDER — CHLORHEXIDINE GLUCONATE CLOTH 2 % EX PADS
6.0000 | MEDICATED_PAD | Freq: Once | CUTANEOUS | Status: AC
  Administered 2022-11-29: 6 via TOPICAL

## 2022-11-29 MED ORDER — OXYCODONE HCL 5 MG PO TABS
ORAL_TABLET | ORAL | Status: AC
  Filled 2022-11-29: qty 1

## 2022-11-29 MED ORDER — ROCURONIUM BROMIDE 100 MG/10ML IV SOLN
INTRAVENOUS | Status: DC | PRN
  Administered 2022-11-29: 40 mg via INTRAVENOUS

## 2022-11-29 MED ORDER — FAMOTIDINE 20 MG PO TABS
20.0000 mg | ORAL_TABLET | Freq: Once | ORAL | Status: AC
  Administered 2022-11-29: 20 mg via ORAL

## 2022-11-29 MED ORDER — MIDAZOLAM HCL 2 MG/2ML IJ SOLN
INTRAMUSCULAR | Status: DC | PRN
  Administered 2022-11-29: 1 mg via INTRAVENOUS

## 2022-11-29 MED ORDER — CEFAZOLIN SODIUM-DEXTROSE 2-4 GM/100ML-% IV SOLN
INTRAVENOUS | Status: AC
  Filled 2022-11-29: qty 100

## 2022-11-29 MED ORDER — CHLORHEXIDINE GLUCONATE 0.12 % MT SOLN
OROMUCOSAL | Status: AC
  Filled 2022-11-29: qty 15

## 2022-11-29 MED ORDER — LIDOCAINE HCL (CARDIAC) PF 100 MG/5ML IV SOSY
PREFILLED_SYRINGE | INTRAVENOUS | Status: DC | PRN
  Administered 2022-11-29: 60 mg via INTRAVENOUS

## 2022-11-29 MED ORDER — CEFAZOLIN SODIUM-DEXTROSE 2-4 GM/100ML-% IV SOLN
2.0000 g | INTRAVENOUS | Status: AC
  Administered 2022-11-29: 2 g via INTRAVENOUS

## 2022-11-29 MED ORDER — HYDROCODONE-ACETAMINOPHEN 5-325 MG PO TABS: 1.0000 | ORAL_TABLET | Freq: Four times a day (QID) | ORAL | 0 refills | Status: DC | PRN

## 2022-11-29 SURGICAL SUPPLY — 52 items
ADH SKN CLS APL DERMABOND .7 (GAUZE/BANDAGES/DRESSINGS) ×1
ANCHOR TIS RET SYS 235ML (MISCELLANEOUS) ×1 IMPLANT
BAG PRESSURE INF REUSE 1000 (BAG) IMPLANT
BAG TISS RTRVL C235 10X14 (MISCELLANEOUS) ×1
BLADE SURG SZ11 CARB STEEL (BLADE) ×1 IMPLANT
CATH REDDICK CHOLANGI 4FR 50CM (CATHETERS) IMPLANT
CAUTERY HOOK MNPLR 1.6 DVNC XI (INSTRUMENTS) ×1 IMPLANT
CLIP LIGATING HEMO O LOK GREEN (MISCELLANEOUS) ×1 IMPLANT
DERMABOND ADVANCED .7 DNX12 (GAUZE/BANDAGES/DRESSINGS) ×1 IMPLANT
DRAPE ARM DVNC X/XI (DISPOSABLE) ×4 IMPLANT
DRAPE C-ARM XRAY 36X54 (DRAPES) IMPLANT
DRAPE COLUMN DVNC XI (DISPOSABLE) ×1 IMPLANT
ELECT CAUTERY BLADE 6.4 (BLADE) ×1 IMPLANT
ELECT REM PT RETURN 9FT ADLT (ELECTROSURGICAL) ×1
ELECTRODE REM PT RTRN 9FT ADLT (ELECTROSURGICAL) ×1 IMPLANT
FORCEPS BPLR FENES DVNC XI (FORCEP) ×1 IMPLANT
FORCEPS PROGRASP DVNC XI (FORCEP) ×1 IMPLANT
GLOVE BIOGEL PI IND STRL 7.0 (GLOVE) ×2 IMPLANT
GLOVE SURG SYN 6.5 ES PF (GLOVE) ×2 IMPLANT
GLOVE SURG SYN 6.5 PF PI (GLOVE) ×2 IMPLANT
GOWN STRL REUS W/ TWL LRG LVL3 (GOWN DISPOSABLE) ×3 IMPLANT
GOWN STRL REUS W/TWL LRG LVL3 (GOWN DISPOSABLE) ×3
GRASPER SUT TROCAR 14GX15 (MISCELLANEOUS) IMPLANT
IRRIGATOR SUCT 8 DISP DVNC XI (IRRIGATION / IRRIGATOR) IMPLANT
IV NS 1000ML (IV SOLUTION)
IV NS 1000ML BAXH (IV SOLUTION) IMPLANT
KIT TURNOVER KIT A (KITS) ×1 IMPLANT
LABEL OR SOLS (LABEL) ×1 IMPLANT
MANIFOLD NEPTUNE II (INSTRUMENTS) ×1 IMPLANT
NDL HYPO 22X1.5 SAFETY MO (MISCELLANEOUS) ×1 IMPLANT
NDL INSUFFLATION 14GA 120MM (NEEDLE) ×1 IMPLANT
NEEDLE HYPO 22X1.5 SAFETY MO (MISCELLANEOUS) ×1 IMPLANT
NEEDLE INSUFFLATION 14GA 120MM (NEEDLE) ×1 IMPLANT
NS IRRIG 500ML POUR BTL (IV SOLUTION) ×1 IMPLANT
OBTURATOR OPTICAL STND 8 DVNC (TROCAR) ×1
OBTURATOR OPTICALSTD 8 DVNC (TROCAR) ×1 IMPLANT
PACK LAP CHOLECYSTECTOMY (MISCELLANEOUS) ×1 IMPLANT
PENCIL SMOKE EVACUATOR (MISCELLANEOUS) ×1 IMPLANT
SEAL UNIV 5-12 XI (MISCELLANEOUS) ×4 IMPLANT
SET TUBE SMOKE EVAC HIGH FLOW (TUBING) ×1 IMPLANT
SOL ELECTROSURG ANTI STICK (MISCELLANEOUS) ×1
SOLUTION ELECTROSURG ANTI STCK (MISCELLANEOUS) ×1 IMPLANT
SPIKE FLUID TRANSFER (MISCELLANEOUS) ×2 IMPLANT
SUT MNCRL 4-0 (SUTURE) ×2
SUT MNCRL 4-0 27XMFL (SUTURE) ×2
SUT VICRYL 0 UR6 27IN ABS (SUTURE) ×1 IMPLANT
SUTURE MNCRL 4-0 27XMF (SUTURE) ×2 IMPLANT
SYR 30ML LL (SYRINGE) IMPLANT
SYSTEM WECK SHIELD CLOSURE (TROCAR) IMPLANT
TRAP FLUID SMOKE EVACUATOR (MISCELLANEOUS) ×1 IMPLANT
TROCAR Z-THREAD FIOS 5X100MM (TROCAR) IMPLANT
WATER STERILE IRR 500ML POUR (IV SOLUTION) ×1 IMPLANT

## 2022-11-29 NOTE — Anesthesia Postprocedure Evaluation (Signed)
Anesthesia Post Note  Patient: Fernande Boyden  Procedure(s) Performed: XI ROBOTIC ASSISTED LAPAROSCOPIC CHOLECYSTECTOMY (Abdomen) INDOCYANINE GREEN FLUORESCENCE IMAGING (ICG) (Abdomen)  Patient location during evaluation: PACU Anesthesia Type: General Level of consciousness: awake and alert Pain management: pain level controlled Vital Signs Assessment: post-procedure vital signs reviewed and stable Respiratory status: spontaneous breathing, nonlabored ventilation, respiratory function stable and patient connected to nasal cannula oxygen Cardiovascular status: blood pressure returned to baseline and stable Postop Assessment: no apparent nausea or vomiting Anesthetic complications: no  No notable events documented.   Last Vitals:  Vitals:   11/29/22 1315 11/29/22 1330  BP: (!) 111/55   Pulse: (!) 59   Resp: 12   Temp: (!) 36.1 C (!) 36.1 C  SpO2: 95%     Last Pain:  Vitals:   11/29/22 1342  TempSrc:   PainSc: 4                  Stephanie Coup

## 2022-11-29 NOTE — Op Note (Signed)
Preoperative diagnosis:  cholelithiasis and pancreatitis  Postoperative diagnosis: same as above  Procedure: Robotic assisted Laparoscopic Cholecystectomy.   Anesthesia: GETA   Surgeon: Sung Amabile  Specimen: Gallbladder  Complications: None  EBL: 15mL  Wound Classification: Clean Contaminated  Indications: see HPI  Findings: Critical view of safety noted Cystic duct and artery identified, ligated and divided, clips remained intact at end of procedure Adequate hemostasis  Description of procedure:  The patient was placed on the operating table in the supine position. SCDs placed, pre-op abx administered.  General anesthesia was induced and OG tube placed by anesthesia. A time-out was completed verifying correct patient, procedure, site, positioning, and implant(s) and/or special equipment prior to beginning this procedure. The abdomen was prepped and draped in the usual sterile fashion.    Veress needle was placed at the Palmer's point and insufflation was started after confirming a positive saline drop test and no immediate increase in abdominal pressure.  After reaching 15 mm, the Veress needle was removed and a 5mm port placed via optiview technique The abdomen was inspected and no abnormalities or injuries were found.  Under direct vision, ports were placed in the following locations: 8mm port under umbilicus, measured 20mm from gallbladder.  One 12 mm patient left of the umbilicus, 8cm to left, one 8 mm port placed to the patient right of the umbilical port 8 cm apart.  1 additional 8 mm port placed lateral to the 12mm port.  Once ports were placed, The table was placed in the reverse Trendelenburg position with the right side up. The Xi platform was brought into the operative field and docked to the ports successfully.  An endoscope was placed through the umbilical port, fenestrated grasper through the adjacent patient right port, prograsp to the far patient left port, and then a  hook cautery in the left port.  The dome of the gallbladder was grasped with prograsp, passed and retracted over the dome of the liver. Adhesions between the gallbladder and omentum, duodenum and transverse colon were lysed via hook cautery. The infundibulum was grasped with the fenestrated grasper and retracted toward the right lower quadrant. This maneuver exposed Calot's triangle. The peritoneum overlying the gallbladder infundibulum was then dissected  and the cystic duct and cystic artery identified.  Critical view of safety with the liver bed clearly visible behind the duct and artery with no additional structures noted.  The cystic duct and cystic artery clipped and divided close to the gallbladder.     The gallbladder was then dissected from its peritoneal and liver bed attachments by electrocautery. Hemostasis was checked prior to removing the hook cautery and the Endo Catch bag was then placed through the 12 mm port and the gallbladder was removed.  The gallbladder was passed off the table as a specimen. There was no evidence of bleeding from the gallbladder fossa or cystic artery or leakage of the bile from the cystic duct stump. The 12 mm port site closed with PMI using 0 vicryl under direct vision.  Abdomen desufflated and secondary trocars were removed under direct vision. No bleeding was noted. All skin incisions then closed with subcuticular sutures of 4-0 monocryl and dressed with topical skin adhesive. The orogastric tube was removed and patient extubated.  The patient tolerated the procedure well and was taken to the postanesthesia care unit in stable condition.  All sponge and instrument count correct at end of procedure.

## 2022-11-29 NOTE — Transfer of Care (Signed)
Immediate Anesthesia Transfer of Care Note  Patient: Sabrina Burns  Procedure(s) Performed: XI ROBOTIC ASSISTED LAPAROSCOPIC CHOLECYSTECTOMY (Abdomen) INDOCYANINE GREEN FLUORESCENCE IMAGING (ICG) (Abdomen)  Patient Location: PACU  Anesthesia Type:General  Level of Consciousness: drowsy and patient cooperative  Airway & Oxygen Therapy: Patient Spontanous Breathing and Patient connected to face mask oxygen  Post-op Assessment: Report given to RN and Post -op Vital signs reviewed and stable  Post vital signs: Reviewed and stable  Last Vitals:  Vitals Value Taken Time  BP 136/61 11/29/22 1250  Temp 36.7 C 11/29/22 1250  Pulse 62 11/29/22 1252  Resp 18 11/29/22 1252  SpO2 98 % 11/29/22 1252  Vitals shown include unfiled device data.  Last Pain:  Vitals:   11/29/22 1053  TempSrc: Temporal  PainSc: 0-No pain         Complications: No notable events documented.

## 2022-11-29 NOTE — Anesthesia Preprocedure Evaluation (Signed)
Anesthesia Evaluation  Patient identified by MRN, date of birth, ID band Patient awake    Reviewed: Allergy & Precautions, NPO status , Patient's Chart, lab work & pertinent test results  History of Anesthesia Complications Negative for: history of anesthetic complications  Airway Mallampati: III  TM Distance: <3 FB Neck ROM: full    Dental  (+) Chipped, Dental Advidsory Given, Poor Dentition   Pulmonary neg pulmonary ROS, neg shortness of breath   Pulmonary exam normal        Cardiovascular negative cardio ROS Normal cardiovascular exam     Neuro/Psych  PSYCHIATRIC DISORDERS  Depression    negative neurological ROS     GI/Hepatic negative GI ROS, Neg liver ROS,neg GERD  ,,  Endo/Other  negative endocrine ROS    Renal/GU      Musculoskeletal   Abdominal   Peds  Hematology negative hematology ROS (+)   Anesthesia Other Findings Past Medical History: 02/2017: Cancer (HCC)     Comment:  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 No date: Complication of anesthesia     Comment:  hypotension  with bladder surgery, hard to wake up No date: Depression No date: H/O: rheumatic fever 11/11/2022: HLD (hyperlipidemia) unkn: Hypercholesteremia No date: Mitral valve prolapse No date: Osteoporosis  Past Surgical History: No date: ABDOMINAL HYSTERECTOMY No date: BACK SURGERY     Comment:  lumbar 11/12/2018: CATARACT EXTRACTION W/PHACO; Right     Comment:  Procedure: CATARACT EXTRACTION PHACO AND INTRAOCULAR               LENS PLACEMENT (IOC) RIGHT;  Surgeon: Lockie Mola, MD;  Location: [facility];  Service:               Ophthalmology;  Laterality: Right; 12/03/2018: CATARACT EXTRACTION W/PHACO; Left     Comment:  Procedure: CATARACT EXTRACTION PHACO AND INTRAOCULAR               LENS PLACEMENT (IOC)  LEFT;  Surgeon: Lockie Mola, MD;  Location: [facility];  Service:                Ophthalmology;  Laterality: Left;  Total Time:               00:57 Total Equivalent Power: 12.3% Cumulative               Dissipated Energy: 7.05 No date: COLONOSCOPY No date: FOOT SURGERY; Right 01/19/2021: FOREIGN BODY REMOVAL; Left     Comment:  Procedure: REMOVAL FOREIGN BODY EXTREMITY;  Surgeon:               Rosetta Posner, DPM;  Location: [facility];                Service: Podiatry;  Laterality: Left; No date: pelvic sling 09/15/2015: SHOULDER ARTHROSCOPY WITH OPEN ROTATOR CUFF REPAIR; Right     Comment:  Procedure: SHOULDER ARTHROSCOPY WITH OPEN ROTATOR CUFF               REPAIR;  Surgeon: Christena Flake, MD;  Location: [facility] ORS;               Service: Orthopedics;  Laterality: Right; 07/03/2017: SUTURE REMOVAL; Left     Comment:  Procedure: REMOVAL OF RETAINED SUTURE FROM LEFT WRIST;  Surgeon: Christena Flake, MD;  Location: [facility]               [facility];  Service: Orthopedics;  Laterality: Left; No date: TONSILLECTOMY No date: TUBAL LIGATION 03/28/2017: WRIST ARTHROSCOPY WITH FOVEAL TRIANGULAR FIBROCARTILAGE  COMPLEX REPAIR; Left     Comment:  Procedure: WRIST ARTHROSCOPY WITH DEBRIDEMENT AND REPAIR              OF TFCC TEAR;  Surgeon: Christena Flake, MD;  Location:               [facility] ORS;  Service: Orthopedics;  Laterality: Left;  BMI    Body Mass Index: 24.07 kg/m      Reproductive/Obstetrics negative OB ROS                             Anesthesia Physical Anesthesia Plan  ASA: 3  Anesthesia Plan: General ETT and General   Post-op Pain Management:    Induction: Intravenous  PONV Risk Score and Plan: 4 or greater and Ondansetron, Midazolam and Dexamethasone  Airway Management Planned: Oral ETT  Additional Equipment:   Intra-op Plan:   Post-operative Plan: Extubation in OR  Informed Consent: I have reviewed the patients History and Physical, chart, labs and discussed the procedure including the risks,  benefits and alternatives for the proposed anesthesia with the patient or authorized representative who has indicated his/her understanding and acceptance.     Dental Advisory Given  Plan Discussed with: Anesthesiologist, CRNA and Surgeon  Anesthesia Plan Comments: (Patient consented for risks of anesthesia including but not limited to:  - adverse reactions to medications - damage to eyes, teeth, lips or other oral mucosa - nerve damage due to positioning  - sore throat or hoarseness - Damage to heart, brain, nerves, lungs, other parts of body or loss of life  Patient voiced understanding.)       Anesthesia Quick Evaluation

## 2022-11-29 NOTE — Discharge Instructions (Addendum)
Laparoscopic Cholecystectomy, Care After This sheet gives you information about how to care for yourself after your procedure. Your doctor may also give you more specific instructions. If you have problems or questions, contact your doctor. Follow these instructions at home: Care for cuts from surgery (incisions)  Follow instructions from your doctor about how to take care of your cuts from surgery. Make sure you: Wash your hands with soap and water before you change your bandage (dressing). If you cannot use soap and water, use hand sanitizer. Change your bandage as told by your doctor. Leave stitches (sutures), skin glue, or skin tape (adhesive) strips in place. They may need to stay in place for 2 weeks or longer. If tape strips get loose and curl up, you may trim the loose edges. Do not remove tape strips completely unless your doctor says it is okay. Do not take baths, swim, or use a hot tub until your doctor says it is okay. OK TO SHOWER 24HRS AFTER YOUR SURGERY.  Check your surgical cut area every day for signs of infection. Check for: More redness, swelling, or pain. More fluid or blood. Warmth. Pus or a bad smell. Activity Do not drive or use heavy machinery while taking prescription pain medicine. Do not play contact sports until your doctor says it is okay. Do not drive for 24 hours if you were given a medicine to help you relax (sedative). Rest as needed. Do not return to work or school until your doctor says it is okay. General instructions  tylenol and advil as needed for discomfort.  Please alternate between the two every four hours as needed for pain.    Use narcotics, if prescribed, only when tylenol and motrin is not enough to control pain.  325-650mg  every 8hrs to max of 3000mg /24hrs (including the 325mg  in every norco dose) for the tylenol.    Advil up to 800mg  per dose every 8hrs as needed for pain.   To prevent or treat constipation while you are taking prescription  pain medicine, your doctor may recommend that you: Drink enough fluid to keep your pee (urine) clear or pale yellow. Take over-the-counter or prescription medicines. Eat foods that are high in fiber, such as fresh fruits and vegetables, whole grains, and beans. Limit foods that are high in fat and processed sugars, such as fried and sweet foods. Contact a doctor if: You develop a rash. You have more redness, swelling, or pain around your surgical cuts. You have more fluid or blood coming from your surgical cuts. Your surgical cuts feel warm to the touch. You have pus or a bad smell coming from your surgical cuts. You have a fever. One or more of your surgical cuts breaks open. You have trouble breathing. You have chest pain. You have pain that is getting worse in your shoulders. You faint or feel dizzy when you stand. You have very bad pain in your belly (abdomen). You are sick to your stomach (nauseous) for more than one day. You have throwing up (vomiting) that lasts for more than one day. You have leg pain. This information is not intended to replace advice given to you by your health care provider. Make sure you discuss any questions you have with your health care provider. Document Released: 12/20/2007 Document Revised: 10/01/2015 Document Reviewed: 08/29/2015 Elsevier Interactive Patient Education  2019 [employer]   [phone]  AMBULATORY SURGERY  DISCHARGE INSTRUCTIONS   The drugs that you were given will stay in your system until  tomorrow so for the next 24 hours you should not:  Drive an automobile Make any legal decisions Drink any alcoholic beverage   You may resume regular meals tomorrow.  Today it is better to start with liquids and gradually work up to solid foods.  You may eat anything you prefer, but it is better to start with liquids, then soup and crackers, and gradually work up to solid foods.   Please notify your doctor immediately if you  have any unusual bleeding, trouble breathing, redness and pain at the surgery site, drainage, fever, or pain not relieved by medication.    Additional Instructions:        Please contact your physician with any problems or Same Day Surgery at [phone], Monday through Friday 6 am to 4 pm, or [facility] at [facility] number at [phone].

## 2022-11-29 NOTE — Interval H&P Note (Signed)
No change. OK to proceed.

## 2022-11-29 NOTE — Anesthesia Procedure Notes (Signed)
Procedure Name: Intubation Date/Time: 11/29/2022 11:41 AM  Performed by: Malva Cogan, CRNAPre-anesthesia Checklist: Patient identified, Patient being monitored, Timeout performed, Emergency Drugs available and Suction available Patient Re-evaluated:Patient Re-evaluated prior to induction Oxygen Delivery Method: Circle system utilized Preoxygenation: Pre-oxygenation with 100% oxygen Induction Type: IV induction Ventilation: Mask ventilation without difficulty Laryngoscope Size: Mac and 3 Grade View: Grade I Tube type: Oral Tube size: 7.0 mm Number of attempts: 1 Airway Equipment and Method: Stylet Placement Confirmation: ETT inserted through vocal cords under direct vision, positive ETCO2 and breath sounds checked- equal and bilateral Secured at: 22 cm Tube secured with: Tape Dental Injury: Teeth and Oropharynx as per pre-operative assessment

## 2022-12-03 NOTE — Group Note (Deleted)

## 2023-08-13 ENCOUNTER — Other Ambulatory Visit: Payer: Self-pay | Admitting: Surgery

## 2023-08-23 ENCOUNTER — Encounter
Admission: RE | Admit: 2023-08-23 | Discharge: 2023-08-23 | Disposition: A | Discharge status: Home / Self Care | Source: Ambulatory Visit | Attending: Surgery | Admitting: Surgery

## 2023-08-23 ENCOUNTER — Encounter: Payer: Self-pay | Admitting: Urgent Care

## 2023-08-23 ENCOUNTER — Other Ambulatory Visit: Payer: Self-pay

## 2023-08-23 VITALS — Ht 62.0 in | Wt 132.0 lb

## 2023-08-23 DIAGNOSIS — E872 Acidosis, unspecified: Secondary | ICD-10-CM | POA: Diagnosis not present

## 2023-08-23 DIAGNOSIS — R9431 Abnormal electrocardiogram [ECG] [EKG]: Secondary | ICD-10-CM | POA: Diagnosis not present

## 2023-08-23 DIAGNOSIS — E785 Hyperlipidemia, unspecified: Secondary | ICD-10-CM | POA: Diagnosis not present

## 2023-08-23 DIAGNOSIS — Z01810 Encounter for preprocedural cardiovascular examination: Secondary | ICD-10-CM | POA: Diagnosis present

## 2023-08-23 DIAGNOSIS — Z01818 Encounter for other preprocedural examination: Secondary | ICD-10-CM | POA: Insufficient documentation

## 2023-08-23 DIAGNOSIS — Z01812 Encounter for preprocedural laboratory examination: Secondary | ICD-10-CM | POA: Diagnosis present

## 2023-08-23 LAB — BASIC METABOLIC PANEL WITH GFR
Anion gap: 6 (ref 5–15)
BUN: 21 mg/dL (ref 8–23)
CO2: 26 mmol/L (ref 22–32)
Calcium: 8.6 mg/dL — ABNORMAL LOW (ref 8.9–10.3)
Chloride: 108 mmol/L (ref 98–111)
Creatinine, Ser: 0.95 mg/dL (ref 0.44–1.00)
GFR, Estimated: >60 mL/min (ref >60)
Glucose, Bld: 131 mg/dL — ABNORMAL HIGH (ref 70–99)
Potassium: 4 mmol/L (ref 3.5–5.1)
Sodium: 140 mmol/L (ref 135–145)

## 2023-08-23 NOTE — Patient Instructions (Addendum)
 Your procedure is scheduled on: Wednesday 08/28/23 To find out your arrival time, please call [phone] between 1PM - 3PM on:   Tuesday 08/27/23 Report to the Registration Desk on the 1st floor of the [facility]. FREE Valet parking is available.  If your arrival time is 6:00 am, do not arrive before that time as the [facility] entrance doors do not open until 6:00 am.  REMEMBER: Instructions that are not followed completely may result in serious medical risk, up to and including death; or upon the discretion of your surgeon and anesthesiologist your surgery may need to be rescheduled.  Do not eat food after midnight the night before surgery.  No gum chewing or hard candies.  You may however, drink CLEAR liquids up to 2 hours before you are scheduled to arrive for your surgery. Do not drink anything within 2 hours of your scheduled arrival time.  Clear liquids include: - water  - apple juice without pulp - gatorade (not RED colors) - black coffee or tea (Do NOT add milk or creamers to the coffee or tea) Do NOT drink anything that is not on this list.  In addition, your doctor has ordered for you to drink the provided:  Ensure Pre-Surgery Clear Carbohydrate Drink  Drinking this carbohydrate drink up to two hours before surgery helps to reduce insulin resistance and improve patient outcomes. Please complete drinking 2 hours before scheduled arrival time.  One week prior to surgery: Stop Anti-inflammatories (NSAIDS) such as Advil, Aleve, Ibuprofen, Motrin, Naproxen, Naprosyn and Aspirin  based products such as Excedrin, Goody's Powder, BC Powder. You may however, continue to take Tylenol  if needed for pain up until the day of surgery.  Stop ANY OVER THE COUNTER supplements and vitamins until after surgery. You can continue your magnesium  Continue taking all prescribed medications.  TAKE ONLY THESE MEDICATIONS THE MORNING OF SURGERY WITH A SIP OF WATER:  If taking your  prescriptions medications changes to morning, take those medications on the day of surgery.  No Alcohol for 24 hours before or after surgery.  No Smoking including e-cigarettes for 24 hours before surgery.  No chewable tobacco products for at least 6 hours before surgery.  No nicotine patches on the day of surgery.  Do not use any "recreational" drugs for at least a week (preferably 2 weeks) before your surgery.  Please be advised that the combination of cocaine and anesthesia may have negative outcomes, up to and including death. If you test positive for cocaine, your surgery will be cancelled.  On the morning of surgery brush your teeth with toothpaste and water, you may rinse your mouth with mouthwash if you wish. Do not swallow any toothpaste or mouthwash.  Use CHG Soap or wipes as directed on instruction sheet.  Do not wear lotions, powders, or perfumes.   Do not shave body hair from the neck down 48 hours before surgery.  Wear comfortable clothing (specific to your surgery type) to the hospital.  Do not wear jewelry, make-up, hairpins, clips or nail polish.  For welded (permanent) jewelry: bracelets, anklets, waist bands, etc.  Please have this removed prior to surgery.  If it is not removed, there is a chance that hospital personnel will need to cut it off on the day of surgery. Contact lenses, hearing aids and dentures may not be worn into surgery.  Do not bring valuables to the hospital. [facility] is not responsible for any missing/lost belongings or valuables.   Notify your  doctor if there is any change in your medical condition (cold, fever, infection).  If you are being discharged the day of surgery, you will not be allowed to drive home. You will need a responsible individual to drive you home and stay with you for 24 hours after surgery.   After surgery, you can help prevent lung complications by doing breathing exercises.  Take deep breaths and cough every 1-2  hours. Your doctor may order a device called an Incentive Spirometer to help you take deep breaths.  Surgery Visitation Policy:  Patients undergoing a surgery or procedure may have two family members or support persons with them as long as the person is not COVID-19 positive or experiencing its symptoms.   Please call the Pre-admissions Testing Dept. at [phone] if you have any questions about these instructions.     Preparing for Surgery with CHLORHEXIDINE  GLUCONATE (CHG) Soap  Chlorhexidine  Gluconate (CHG) Soap  o An antiseptic cleaner that kills germs and bonds with the skin to continue killing germs even after washing  o Used for showering the night before surgery and morning of surgery  Before surgery, you can play an important role by reducing the number of germs on your skin.  CHG (Chlorhexidine  gluconate) soap is an antiseptic cleanser which kills germs and bonds with the skin to continue killing germs even after washing.  Please do not use if you have an allergy to CHG or antibacterial soaps. If your skin becomes reddened/irritated stop using the CHG.  1. Shower the NIGHT BEFORE SURGERY and the MORNING OF SURGERY with CHG soap.  2. If you choose to wash your hair, wash your hair first as usual with your normal shampoo.  3. After shampooing, rinse your hair and body thoroughly to remove the shampoo.  4. Use CHG as you would any other liquid soap. You can apply CHG directly to the skin and wash gently with a scrungie or a clean washcloth.  5. Apply the CHG soap to your body only from the neck down. Do not use on open wounds or open sores. Avoid contact with your eyes, ears, mouth, and genitals (private parts). Wash face and genitals (private parts) with your normal soap.  6. Wash thoroughly, paying special attention to the area where your surgery will be performed.  7. Thoroughly rinse your body with warm water.  8. Do not shower/wash with your normal soap after  using and rinsing off the CHG soap.  9. Pat yourself dry with a clean towel.  10. Wear clean pajamas to bed the night before surgery.  12. Place clean sheets on your bed the night of your first shower and do not sleep with pets.  13. Shower again with the CHG soap on the day of surgery prior to arriving at the hospital.  14. Do not apply any deodorants/lotions/powders.  15. Please wear clean clothes to the hospital.

## 2023-08-27 MED ORDER — CEFAZOLIN SODIUM-DEXTROSE 2-4 GM/100ML-% IV SOLN
2.0000 g | INTRAVENOUS | Status: AC
  Administered 2023-08-28: 2 g via INTRAVENOUS

## 2023-08-27 MED ORDER — LACTATED RINGERS IV SOLN: INTRAVENOUS | Status: DC

## 2023-08-27 MED ORDER — ORAL CARE MOUTH RINSE
15.0000 mL | Freq: Once | OROMUCOSAL | Status: AC
Start: 2023-08-27 — End: 2023-08-28

## 2023-08-27 MED ORDER — CHLORHEXIDINE GLUCONATE 0.12 % MT SOLN
15.0000 mL | Freq: Once | OROMUCOSAL | Status: AC
Start: 2023-08-27 — End: 2023-08-28
  Administered 2023-08-28: 15 mL via OROMUCOSAL

## 2023-08-28 ENCOUNTER — Encounter
Admission: RE | Disposition: A | Discharge status: Home / Self Care | Payer: Self-pay | Source: Home / Self Care | Attending: Surgery

## 2023-08-28 ENCOUNTER — Other Ambulatory Visit: Payer: Self-pay

## 2023-08-28 ENCOUNTER — Ambulatory Visit: Admitting: General Practice

## 2023-08-28 ENCOUNTER — Ambulatory Visit
Admission: RE | Admit: 2023-08-28 | Discharge: 2023-08-28 | Disposition: A | Discharge status: Home / Self Care | Attending: Surgery | Admitting: Surgery

## 2023-08-28 ENCOUNTER — Encounter: Payer: Self-pay | Admitting: Surgery

## 2023-08-28 DIAGNOSIS — F32A Depression, unspecified: Secondary | ICD-10-CM | POA: Diagnosis not present

## 2023-08-28 DIAGNOSIS — M654 Radial styloid tenosynovitis [de Quervain]: Secondary | ICD-10-CM | POA: Diagnosis present

## 2023-08-28 HISTORY — PX: DORSAL COMPARTMENT RELEASE: SHX5039

## 2023-08-28 SURGERY — RELEASE, FIRST DORSAL COMPARTMENT, HAND
Anesthesia: General | Site: Wrist | Laterality: Right

## 2023-08-28 MED ORDER — DEXAMETHASONE SODIUM PHOSPHATE 10 MG/ML IJ SOLN
INTRAMUSCULAR | Status: AC
  Filled 2023-08-28: qty 1

## 2023-08-28 MED ORDER — METOCLOPRAMIDE HCL 5 MG/ML IJ SOLN: 5.0000 mg | Freq: Three times a day (TID) | INTRAMUSCULAR | Status: DC | PRN

## 2023-08-28 MED ORDER — MIDAZOLAM HCL 2 MG/2ML IJ SOLN
INTRAMUSCULAR | Status: AC
  Filled 2023-08-28: qty 2

## 2023-08-28 MED ORDER — ONDANSETRON HCL 4 MG PO TABS
4.0000 mg | ORAL_TABLET | Freq: Four times a day (QID) | ORAL | Status: DC | PRN
Start: 2023-08-28 — End: 2023-08-28

## 2023-08-28 MED ORDER — OXYCODONE HCL 5 MG/5ML PO SOLN: 5.0000 mg | Freq: Once | ORAL | Status: DC | PRN

## 2023-08-28 MED ORDER — FENTANYL CITRATE (PF) 100 MCG/2ML IJ SOLN: 25.0000 ug | INTRAMUSCULAR | Status: DC | PRN

## 2023-08-28 MED ORDER — FENTANYL CITRATE (PF) 100 MCG/2ML IJ SOLN
INTRAMUSCULAR | Status: AC
  Filled 2023-08-28: qty 2

## 2023-08-28 MED ORDER — METOCLOPRAMIDE HCL 10 MG PO TABS: 5.0000 mg | ORAL_TABLET | Freq: Three times a day (TID) | ORAL | Status: DC | PRN

## 2023-08-28 MED ORDER — LIDOCAINE HCL (PF) 2 % IJ SOLN
INTRAMUSCULAR | Status: AC
  Filled 2023-08-28: qty 5

## 2023-08-28 MED ORDER — OXYCODONE HCL 5 MG PO TABS: 5.0000 mg | ORAL_TABLET | Freq: Once | ORAL | Status: DC | PRN

## 2023-08-28 MED ORDER — ACETAMINOPHEN 325 MG PO TABS: 325.0000 mg | ORAL_TABLET | Freq: Four times a day (QID) | ORAL | Status: DC | PRN

## 2023-08-28 MED ORDER — BUPIVACAINE HCL (PF) 0.5 % IJ SOLN
INTRAMUSCULAR | Status: AC
Start: 2023-08-28 — End: ?
  Filled 2023-08-28: qty 30

## 2023-08-28 MED ORDER — MIDAZOLAM HCL 2 MG/2ML IJ SOLN
INTRAMUSCULAR | Status: DC | PRN
  Administered 2023-08-28: 2 mg via INTRAVENOUS

## 2023-08-28 MED ORDER — EPHEDRINE SULFATE-NACL 50-0.9 MG/10ML-% IV SOSY
PREFILLED_SYRINGE | INTRAVENOUS | Status: DC | PRN
  Administered 2023-08-28 (×2): 10 mg via INTRAVENOUS

## 2023-08-28 MED ORDER — ONDANSETRON HCL 4 MG/2ML IJ SOLN: 4.0000 mg | Freq: Four times a day (QID) | INTRAMUSCULAR | Status: DC | PRN

## 2023-08-28 MED ORDER — DEXAMETHASONE SODIUM PHOSPHATE 10 MG/ML IJ SOLN
INTRAMUSCULAR | Status: DC | PRN
  Administered 2023-08-28: 10 mg via INTRAVENOUS

## 2023-08-28 MED ORDER — HYDROCODONE-ACETAMINOPHEN 5-325 MG PO TABS: 1.0000 | ORAL_TABLET | ORAL | Status: DC | PRN

## 2023-08-28 MED ORDER — FENTANYL CITRATE (PF) 100 MCG/2ML IJ SOLN
INTRAMUSCULAR | Status: DC | PRN
  Administered 2023-08-28: 50 ug via INTRAVENOUS

## 2023-08-28 MED ORDER — CEFAZOLIN SODIUM-DEXTROSE 2-4 GM/100ML-% IV SOLN
INTRAVENOUS | Status: AC
  Filled 2023-08-28: qty 100

## 2023-08-28 MED ORDER — PROPOFOL 10 MG/ML IV BOLUS
INTRAVENOUS | Status: DC | PRN
  Administered 2023-08-28: 100 ug/kg/min via INTRAVENOUS
  Administered 2023-08-28: 100 mg via INTRAVENOUS

## 2023-08-28 MED ORDER — EPHEDRINE 5 MG/ML INJ
INTRAVENOUS | Status: AC
  Filled 2023-08-28: qty 5

## 2023-08-28 MED ORDER — KETOROLAC TROMETHAMINE 30 MG/ML IJ SOLN
INTRAMUSCULAR | Status: AC
  Filled 2023-08-28: qty 1

## 2023-08-28 MED ORDER — BUPIVACAINE HCL (PF) 0.5 % IJ SOLN
INTRAMUSCULAR | Status: DC | PRN
Start: 2023-08-28 — End: 2023-08-28
  Administered 2023-08-28: 5 mL

## 2023-08-28 MED ORDER — PROPOFOL 1000 MG/100ML IV EMUL
INTRAVENOUS | Status: AC
  Filled 2023-08-28: qty 100

## 2023-08-28 MED ORDER — HYDROCODONE-ACETAMINOPHEN 5-325 MG PO TABS: 1.0000 | ORAL_TABLET | Freq: Four times a day (QID) | ORAL | 0 refills | Status: AC | PRN

## 2023-08-28 MED ORDER — SODIUM CHLORIDE 0.9 % IV SOLN
INTRAVENOUS | Status: DC
Start: 2023-08-28 — End: 2023-08-28

## 2023-08-28 MED ORDER — PHENYLEPHRINE 80 MCG/ML (10ML) SYRINGE FOR IV PUSH (FOR BLOOD PRESSURE SUPPORT)
PREFILLED_SYRINGE | INTRAVENOUS | Status: AC
  Filled 2023-08-28: qty 10

## 2023-08-28 MED ORDER — CHLORHEXIDINE GLUCONATE 0.12 % MT SOLN
OROMUCOSAL | Status: AC
  Filled 2023-08-28: qty 15

## 2023-08-28 MED ORDER — KETOROLAC TROMETHAMINE 30 MG/ML IJ SOLN
INTRAMUSCULAR | Status: DC | PRN
  Administered 2023-08-28: 30 mg via INTRAVENOUS

## 2023-08-28 MED ORDER — LIDOCAINE HCL (CARDIAC) PF 100 MG/5ML IV SOSY
PREFILLED_SYRINGE | INTRAVENOUS | Status: DC | PRN
  Administered 2023-08-28: 60 mg via INTRAVENOUS

## 2023-08-28 MED ORDER — 0.9 % SODIUM CHLORIDE (POUR BTL) OPTIME
TOPICAL | Status: DC | PRN
  Administered 2023-08-28: 300 mL

## 2023-08-28 SURGICAL SUPPLY — 30 items
BENZOIN TINCTURE PRP APPL 2/3 (GAUZE/BANDAGES/DRESSINGS) ×1 IMPLANT
BNDG COHESIVE 4X5 TAN STRL LF (GAUZE/BANDAGES/DRESSINGS) ×1 IMPLANT
BNDG ELASTIC 4X5.8 VLCR NS LF (GAUZE/BANDAGES/DRESSINGS) ×1 IMPLANT
BNDG ESMARCH 4X12 STRL LF (GAUZE/BANDAGES/DRESSINGS) ×1 IMPLANT
CHLORAPREP W/TINT 26 (MISCELLANEOUS) ×2 IMPLANT
CORD BIP STRL DISP 12FT (MISCELLANEOUS) ×1 IMPLANT
CUFF TOURN SGL QUICK 18X4 (TOURNIQUET CUFF) IMPLANT
CUFF TRNQT CYL 24X4X16.5-23 (TOURNIQUET CUFF) IMPLANT
ELECTRODE REM PT RTRN 9FT ADLT (ELECTROSURGICAL) ×1 IMPLANT
FORCEPS JEWEL BIP 4-3/4 STR (INSTRUMENTS) ×1 IMPLANT
GAUZE SPONGE 4X4 12PLY STRL (GAUZE/BANDAGES/DRESSINGS) ×1 IMPLANT
GAUZE XEROFORM 1X8 LF (GAUZE/BANDAGES/DRESSINGS) ×1 IMPLANT
GLOVE INDICATOR 8.0 STRL GRN (GLOVE) ×1 IMPLANT
GOWN STRL REUS W/ TWL LRG LVL3 (GOWN DISPOSABLE) ×1 IMPLANT
GOWN STRL REUS W/ TWL XL LVL3 (GOWN DISPOSABLE) ×1 IMPLANT
KIT TURNOVER KIT A (KITS) ×1 IMPLANT
LOOP VESSEL MAXI 1X406 RED (MISCELLANEOUS) ×1 IMPLANT
MANIFOLD NEPTUNE II (INSTRUMENTS) ×1 IMPLANT
NS IRRIG 500ML POUR BTL (IV SOLUTION) ×1 IMPLANT
PACK EXTREMITY ARMC (MISCELLANEOUS) ×1 IMPLANT
PAD ABD DERMACEA PRESS 5X9 (GAUZE/BANDAGES/DRESSINGS) ×1 IMPLANT
PAD CAST 4YDX4 CTTN HI CHSV (CAST SUPPLIES) ×3 IMPLANT
PENCIL SMOKE EVACUATOR (MISCELLANEOUS) ×1 IMPLANT
STAPLER SKIN PROX 35W (STAPLE) IMPLANT
STOCKINETTE IMPERVIOUS 9X36 MD (GAUZE/BANDAGES/DRESSINGS) ×1 IMPLANT
SUT PROLENE 4 0 PS 2 18 (SUTURE) IMPLANT
SUT VIC AB 2-0 CT1 (SUTURE) ×1 IMPLANT
SUT VIC AB 2-0 CT1 36 (SUTURE) IMPLANT
TRAP FLUID SMOKE EVACUATOR (MISCELLANEOUS) ×1 IMPLANT
WATER STERILE IRR 500ML POUR (IV SOLUTION) ×1 IMPLANT

## 2023-08-28 NOTE — Anesthesia Preprocedure Evaluation (Signed)
 Anesthesia Evaluation  Patient identified by MRN, date of birth, ID band Patient awake    Reviewed: Allergy & Precautions, NPO status , Patient's Chart, lab work & pertinent test results  History of Anesthesia Complications Negative for: history of anesthetic complications  Airway Mallampati: III  TM Distance: <3 FB Neck ROM: full    Dental  (+) Chipped, Dental Advidsory Given, Poor Dentition   Pulmonary neg pulmonary ROS, neg shortness of breath   Pulmonary exam normal        Cardiovascular Normal cardiovascular exam+ Valvular Problems/Murmurs MVP      Neuro/Psych  PSYCHIATRIC DISORDERS  Depression    negative neurological ROS     GI/Hepatic negative GI ROS, Neg liver ROS,neg GERD  ,,  Endo/Other  negative endocrine ROS    Renal/GU      Musculoskeletal   Abdominal   Peds  Hematology negative hematology ROS (+)   Anesthesia Other Findings Past Medical History: 02/2017: Cancer (HCC)     Comment:  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 No date: Complication of anesthesia     Comment:  hypotension  with bladder surgery, hard to wake up No date: Depression No date: H/O: rheumatic fever 11/11/2022: HLD (hyperlipidemia) unkn: Hypercholesteremia No date: Mitral valve prolapse No date: Osteoporosis  Past Surgical History: No date: ABDOMINAL HYSTERECTOMY No date: BACK SURGERY     Comment:  lumbar 11/12/2018: CATARACT EXTRACTION W/PHACO; Right     Comment:  Procedure: CATARACT EXTRACTION PHACO AND INTRAOCULAR               LENS PLACEMENT (IOC) RIGHT;  Surgeon: Annell Kidney, MD;  Location: [facility];  Service:               Ophthalmology;  Laterality: Right; 12/03/2018: CATARACT EXTRACTION W/PHACO; Left     Comment:  Procedure: CATARACT EXTRACTION PHACO AND INTRAOCULAR               LENS PLACEMENT (IOC)  LEFT;  Surgeon: Annell Kidney, MD;  Location: [facility];   Service:               Ophthalmology;  Laterality: Left;  Total Time:               00:57 Total Equivalent Power: 12.3% Cumulative               Dissipated Energy: 7.05 No date: COLONOSCOPY No date: FOOT SURGERY; Right 01/19/2021: FOREIGN BODY REMOVAL; Left     Comment:  Procedure: REMOVAL FOREIGN BODY EXTREMITY;  Surgeon:               Pink Bridges, DPM;  Location: [facility];                Service: Podiatry;  Laterality: Left; No date: pelvic sling 09/15/2015: SHOULDER ARTHROSCOPY WITH OPEN ROTATOR CUFF REPAIR; Right     Comment:  Procedure: SHOULDER ARTHROSCOPY WITH OPEN ROTATOR CUFF               REPAIR;  Surgeon: Elner Hahn, MD;  Location: [facility] OR[facility];               Service: Orthopedics;  Laterality: Right; 07/03/2017: SUTURE REMOVAL; Left     Comment:  Procedure: REMOVAL OF RETAINED SUTURE FROM LEFT WRIST;  Surgeon: Elner Hahn, MD;  Location: [facility]               [facility];  Service: Orthopedics;  Laterality: Left; No date: TONSILLECTOMY No date: TUBAL LIGATION 03/28/2017: WRIST ARTHROSCOPY WITH FOVEAL TRIANGULAR FIBROCARTILAGE  COMPLEX REPAIR; Left     Comment:  Procedure: WRIST ARTHROSCOPY WITH DEBRIDEMENT AND REPAIR              OF TFCC TEAR;  Surgeon: Elner Hahn, MD;  Location:               [facility] ORS;  Service: Orthopedics;  Laterality: Left;  BMI    Body Mass Index: 24.07 kg/m      Reproductive/Obstetrics negative OB ROS                             Anesthesia Physical Anesthesia Plan  ASA: 2  Anesthesia Plan: General   Post-op Pain Management:    Induction: Intravenous  PONV Risk Score and Plan: 3 and Ondansetron  and Dexamethasone   Airway Management Planned: LMA  Additional Equipment:   Intra-op Plan:   Post-operative Plan: Extubation in OR  Informed Consent: I have reviewed the patients History and Physical, chart, labs and discussed the procedure including the risks, benefits and alternatives  for the proposed anesthesia with the patient or authorized representative who has indicated his/her understanding and acceptance.     Dental Advisory Given  Plan Discussed with: Anesthesiologist, CRNA and Surgeon  Anesthesia Plan Comments: (Patient consented for risks of anesthesia including but not limited to:  - adverse reactions to medications - damage to eyes, teeth, lips or other oral mucosa - nerve damage due to positioning  - sore throat or hoarseness - Damage to heart, brain, nerves, lungs, other parts of body or loss of life  Patient voiced understanding and assent.)       Anesthesia Quick Evaluation

## 2023-08-28 NOTE — Discharge Instructions (Addendum)
 Orthopedic discharge instructions: Keep dressing dry and intact. Keep hand elevated above heart level. May shower after dressing removed on postop day 4 (Sunday). Cover sutures with Band-Aids after drying off. Apply ice to affected area frequently. Take ibuprofen 600 mg TID with meals OR Aleve 1-2 tabs BID for 3-5 days, then as necessary. Take ES Tylenol  or pain medication as prescribed when needed.  Return for follow-up in 10-14 days or as scheduled.

## 2023-08-28 NOTE — Transfer of Care (Signed)
 Immediate Anesthesia Transfer of Care Note  Patient: Sabrina Burns  Procedure(s) Performed: RELEASE, FIRST DORSAL COMPARTMENT, HAND (Right: Wrist)  Patient Location: PACU  Anesthesia Type:General  Level of Consciousness: sedated  Airway & Oxygen Therapy: Patient Spontanous Breathing and Patient connected to face mask oxygen  Post-op Assessment: Report given to RN and Post -op Vital signs reviewed and stable  Post vital signs: Reviewed and stable  Last Vitals:  Vitals Value Taken Time  BP 110/45 08/28/23 0919  Temp 36.3 C 08/28/23 0919  Pulse 74 08/28/23 0922  Resp 12 08/28/23 0922  SpO2 98 % 08/28/23 0922  Vitals shown include unfiled device data.  Last Pain:  Vitals:   08/28/23 0919  PainSc: Asleep      Patients Stated Pain Goal: 2 (08/28/23 0715)  Complications: There were no known notable events for this encounter.

## 2023-08-28 NOTE — Anesthesia Postprocedure Evaluation (Signed)
 Anesthesia Post Note  Patient: Euell Herrlich  Procedure(s) Performed: RELEASE, FIRST DORSAL COMPARTMENT, HAND (Right: Wrist)  Patient location during evaluation: PACU Anesthesia Type: General Level of consciousness: awake and alert Pain management: pain level controlled Vital Signs Assessment: post-procedure vital signs reviewed and stable Respiratory status: spontaneous breathing, nonlabored ventilation, respiratory function stable and patient connected to nasal cannula oxygen Cardiovascular status: blood pressure returned to baseline and stable Postop Assessment: no apparent nausea or vomiting Anesthetic complications: no  There were no known notable events for this encounter.   Last Vitals:  Vitals:   08/28/23 1017 08/28/23 1026  BP: (!) 140/50 (!) 157/69  Pulse: 80 77  Resp: 15 17  Temp: (!) 36.3 C (!) 36.1 C  SpO2: 91% 100%    Last Pain:  Vitals:   08/28/23 1026  TempSrc: Temporal  PainSc: 0-No pain                 Enrique Harvest

## 2023-08-28 NOTE — Anesthesia Procedure Notes (Addendum)
 Procedure Name: LMA Insertion Date/Time: 08/28/2023 8:29 AM  Performed by: Calvary Difranco R, CRNAPre-anesthesia Checklist: Patient identified, Emergency Drugs available, Suction available, Patient being monitored and Timeout performed Patient Re-evaluated:Patient Re-evaluated prior to induction Oxygen Delivery Method: Circle system utilized Preoxygenation: Pre-oxygenation with 100% oxygen Induction Type: IV induction LMA: LMA inserted LMA Size: 3.0 Number of attempts: 1 Placement Confirmation: positive ETCO2 and breath sounds checked- equal and bilateral Tube secured with: Tape Dental Injury: Teeth and Oropharynx as per pre-operative assessment

## 2023-08-28 NOTE — H&P (Signed)
 History of Present Illness:  Sabrina Burns is a 79 y.o. female who presents for history and physical for right wrist Dequerveins tenosynovitis. Date of surgery 08/28/2023 with Dr. Daun Epstein. She has had several months of increasing pain along the radial aspect of the wrist. She has seen Dr. Daun Epstein and discussed release of the first dorsal compartment. She has agreed and consented procedure. She has had several months of conservative treatment consisting of bracing, anti-inflammatory medications and topical medications with no improvement. No numbness or tingling. No trauma or injury. X-rays are negative for any acute bony abnormality or any significant arthropathy. Pain is 10 out of 10 with grasping and gripping. 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 Daun Epstein)  Arthroscopic debridement with arthroscopically assisted TFCC repair,left wrist Left 03/28/2017 (Dr. Daun Epstein)  Removal painful retained sutures x 2 left wrist Left 07/03/2017 (Dr. Daun Epstein)  ROBOT  ASSISTED LAPAROSCOPIC CHOLECYSTECTOMY 11/29/2022 (Dr. Rosea Conch)  HYSTERECTOMY  Pelvic sling  TONSILLECTOMY  TUBAL LIGATION   Family History:  Osteoporosis (Thinning of bones) Mother  Myocardial Infarction (Heart attack) Father  Brain cancer Brother  Lung cancer Brother   Social History:   Socioeconomic History:  Marital status: Widowed  Occupational History  Occupation: Retired  Tobacco Use  Smoking status: Never  Passive exposure: Never  Smokeless tobacco: Never  Vaping Use  Vaping status: Never Used  Substance and Sexual Activity  Alcohol use: No  Drug use: No  Sexual activity: Not Currently   Social Drivers of Health:   Physicist, medical Strain: Low Risk (07/26/2023)  Overall Financial Resource Strain (CARDIA)  Difficulty of Paying Living Expenses: Not hard at all  Food Insecurity: No Food Insecurity (07/26/2023)  Hunger Vital Sign  Worried About Running Out of Food in the Last Year: Never true  Ran Out of Food in the Last Year: Never true  Transportation Needs: No Transportation Needs (07/26/2023)  PRAPARE - Risk analyst (Medical): No  Lack of Transportation (Non-Medical): No   Review of Systems:  A comprehensive 14 point ROS was performed, reviewed, and the pertinent orthopaedic findings are documented in the HPI.  Physical Exam: Vitals:  08/21/23 1443  BP: 118/62  Weight: 62.1 kg (136 lb 12.8 oz)  Height: 157.5 cm (5\' 2" )  PainSc: 7  PainLoc: Wrist   General:  Well developed, well nourished, no apparent distress, normal affect, normal gait with no antalgic component.   HEENT: Head normocephalic, atraumatic, PERRL.   Abdomen: Soft, non tender, non distended, Bowel sounds present.  Heart: Examination of the heart reveals regular, rate, and rhythm. There is no  murmur noted on ascultation. There is a normal apical pulse.  Lungs: Lungs are clear to auscultation. There is no wheeze, rhonchi, or crackles. There is normal expansion  of bilateral chest walls.   Right wrist exam: Skin inspection of the right wrist is unremarkable. No swelling, erythema, ecchymosis, abrasions, or other skin abnormalities are identified. She has moderate tenderness palpation over the first dorsal compartment. This pain is aggravated with flexion/adduction of the thumb, consistent with a positive Finklestein's test. She exhibits full range of motion of her wrist without any pain or catching. She is able to active flex and extend all digits without any pain or triggering. She is grossly neurovascularly intact to all digits.  X-rays:  AP, lateral, and oblique views of the right wrist reviewed by me today show moderate degenerative changes of the scaphoid trapezial joint, but otherwise are unremarkable. No fractures or acute bony changes are identified.  Assessment: De Quervain's tenosynovitis right wrist.   Plan:  The treatment options were discussed with the patient. In addition, patient educational materials were provided regarding the diagnosis and treatment options. The patient is quite frustrated by her symptoms and functional limitations, and is ready to consider more aggressive treatment options. She is offered but declines a steroid injection, stating that she "hates needles". Therefore, I have recommended a surgical procedure, specifically a release of the first dorsal compartment. The procedure was discussed with the patient, as were the potential risks (including bleeding, infection, nerve and/or blood vessel injury, persistent or recurrent pain, stiffness of the thumb, weakness of grip, need for further surgery, blood clots, strokes, heart attacks and/or arhythmias, pneumonia, etc.) and benefits. The patient states her understanding and wishes to proceed. All of the patient's questions and concerns were answered. She can call any time with further concerns. She will follow up post-surgery, routine.    H&P reviewed and patient re-examined. No  changes.

## 2023-08-28 NOTE — Op Note (Signed)
 08/28/2023  9:23 AM  Patient:   Sabrina Burns  Pre-Op Diagnosis:   DeQuervain's tenosynovitis, right wrist.  Post-Op Diagnosis:   Same.  Procedure:   Release of first dorsal compartment, right wrist.  Surgeon:   Lonnie Roberts, MD  Assistant:   [place] Lee Napolitano, PA-S  Anesthesia:   General LMA  Findings:   As above.  Complications:   None  EBL:   0 cc  Fluids:   700 cc crystalloid  TT:   17 minutes at 250 mmHg  Drains:   None  Closure:   4-0 Prolene interrupted sutures  Brief Clinical Note:   The patient is a 78 year old female with a history of progressively worsening radial sided wrist pain. The symptoms have progressed despite medications, activity modification, splinting, etc. The patient's history and examination are consistent with DeQuervain's tenosynovitis. The patient presents at this time for release of the right first dorsal compartment.  Procedure:   The patient was brought into the operating room and lain in the supine position. After adequate general laryngeal mask anesthesia was achieved, the right hand and upper extremity were prepped with ChloraPrep solution before being draped sterilely. Preoperative antibiotics were administered. A timeout was performed to verify the appropriate surgical site before the limb was exsanguinated with an Esmarch and the tourniquet inflated to 250 mmHg.    A 1.5-2 cm incision was made transversely over the first dorsal compartment. The incision was carried down through subcutaneous tissues with care taken to identify and protect the sensory nerves and veins running in this area. The underlying retinaculum was identified. The first dorsal compartment was released from proximal to distal using Metzenbaum scissors. Areas of thickened tenosynovium were debrided sharply. The underlying tendons were carefully inspected and found to be intact. No additional adhesions were identified.  The wound was copiously irrigated with  sterile saline solution before the skin was reapproximated using 4-0 Prolene interrupted sutures. A total of 5 cc of 0.5% plain Sensorcaine  was injected in and around the incision to help with postoperative analgesia before a sterile bulky dressing was applied to the wrist. The patient was then awakened, extubated, and returned to the recovery room in satisfactory condition after tolerating the procedure well.

## 2023-08-29 ENCOUNTER — Encounter: Payer: Self-pay | Admitting: Surgery
